# Patient Record
Sex: Male | Born: 1945 | Race: White | Hispanic: No | Smoking: Current every day smoker
Health system: Southern US, Community
[De-identification: ages and names within clinical notes are randomized; demographics above are authoritative.]

## PROBLEM LIST (undated history)

## (undated) DIAGNOSIS — E7800 Pure hypercholesterolemia, unspecified: Secondary | ICD-10-CM

## (undated) DIAGNOSIS — J449 Chronic obstructive pulmonary disease, unspecified: Secondary | ICD-10-CM

## (undated) DIAGNOSIS — I739 Peripheral vascular disease, unspecified: Secondary | ICD-10-CM

## (undated) DIAGNOSIS — R519 Headache, unspecified: Secondary | ICD-10-CM

## (undated) DIAGNOSIS — G4730 Sleep apnea, unspecified: Secondary | ICD-10-CM

## (undated) DIAGNOSIS — N400 Enlarged prostate without lower urinary tract symptoms: Secondary | ICD-10-CM

## (undated) DIAGNOSIS — E119 Type 2 diabetes mellitus without complications: Secondary | ICD-10-CM

## (undated) DIAGNOSIS — I2510 Atherosclerotic heart disease of native coronary artery without angina pectoris: Secondary | ICD-10-CM

## (undated) DIAGNOSIS — K219 Gastro-esophageal reflux disease without esophagitis: Secondary | ICD-10-CM

## (undated) DIAGNOSIS — K254 Chronic or unspecified gastric ulcer with hemorrhage: Secondary | ICD-10-CM

## (undated) DIAGNOSIS — C4490 Unspecified malignant neoplasm of skin, unspecified: Secondary | ICD-10-CM

## (undated) DIAGNOSIS — R51 Headache: Secondary | ICD-10-CM

## (undated) HISTORY — PX: OTHER SURGICAL HISTORY: SHX169

## (undated) HISTORY — PX: CHOLECYSTECTOMY: SHX55

## (undated) HISTORY — PX: CORONARY ANGIOPLASTY: SHX604

## (undated) HISTORY — DX: Unspecified malignant neoplasm of skin, unspecified: C44.90

## (undated) HISTORY — PX: HERNIA REPAIR: SHX51

---

## 2005-05-15 ENCOUNTER — Emergency Department: Payer: Self-pay | Admitting: General Practice

## 2007-09-30 ENCOUNTER — Ambulatory Visit: Payer: Self-pay | Admitting: Surgery

## 2007-10-01 ENCOUNTER — Ambulatory Visit (HOSPITAL_COMMUNITY): Admission: RE | Admit: 2007-10-01 | Discharge: 2007-10-02 | Payer: Self-pay | Admitting: Surgery

## 2007-10-02 ENCOUNTER — Ambulatory Visit: Payer: Self-pay | Admitting: Surgery

## 2007-10-03 ENCOUNTER — Inpatient Hospital Stay (HOSPITAL_COMMUNITY): Admission: RE | Admit: 2007-10-03 | Discharge: 2007-10-07 | Payer: Self-pay | Admitting: Surgery

## 2007-10-03 ENCOUNTER — Encounter: Payer: Self-pay | Admitting: Surgery

## 2007-10-04 ENCOUNTER — Encounter: Payer: Self-pay | Admitting: Surgery

## 2007-10-21 ENCOUNTER — Ambulatory Visit: Payer: Self-pay | Admitting: Surgery

## 2007-11-22 ENCOUNTER — Ambulatory Visit: Payer: Self-pay | Admitting: Vascular Surgery

## 2007-11-24 ENCOUNTER — Inpatient Hospital Stay (HOSPITAL_COMMUNITY): Admission: EM | Admit: 2007-11-24 | Discharge: 2007-11-29 | Payer: Self-pay | Admitting: Emergency Medicine

## 2007-11-24 ENCOUNTER — Ambulatory Visit: Payer: Self-pay | Admitting: Family Medicine

## 2007-11-25 ENCOUNTER — Ambulatory Visit: Payer: Self-pay | Admitting: Vascular Surgery

## 2007-12-02 ENCOUNTER — Encounter: Payer: Self-pay | Admitting: *Deleted

## 2007-12-02 ENCOUNTER — Ambulatory Visit: Payer: Self-pay | Admitting: Family Medicine

## 2007-12-02 DIAGNOSIS — I2699 Other pulmonary embolism without acute cor pulmonale: Secondary | ICD-10-CM | POA: Insufficient documentation

## 2007-12-02 DIAGNOSIS — I10 Essential (primary) hypertension: Secondary | ICD-10-CM | POA: Insufficient documentation

## 2007-12-02 DIAGNOSIS — S81809A Unspecified open wound, unspecified lower leg, initial encounter: Secondary | ICD-10-CM

## 2007-12-02 DIAGNOSIS — S91009A Unspecified open wound, unspecified ankle, initial encounter: Secondary | ICD-10-CM

## 2007-12-02 DIAGNOSIS — Z86711 Personal history of pulmonary embolism: Secondary | ICD-10-CM | POA: Insufficient documentation

## 2007-12-02 DIAGNOSIS — S81009A Unspecified open wound, unspecified knee, initial encounter: Secondary | ICD-10-CM

## 2007-12-02 LAB — CONVERTED CEMR LAB

## 2007-12-10 ENCOUNTER — Encounter: Payer: Self-pay | Admitting: Family Medicine

## 2009-02-20 ENCOUNTER — Encounter (INDEPENDENT_AMBULATORY_CARE_PROVIDER_SITE_OTHER): Payer: Self-pay | Admitting: *Deleted

## 2009-02-20 DIAGNOSIS — F17200 Nicotine dependence, unspecified, uncomplicated: Secondary | ICD-10-CM

## 2010-09-27 NOTE — Procedures (Signed)
DUPLEX ULTRASOUND OF ABDOMINAL AORTA   INDICATION:  Pulsatile abdomen.   HISTORY:  Diabetes:  No.  Cardiac:  No.  Hypertension:  No.  Smoking:  Yes.  Connective Tissue Disorder:  Family History:  Previous Surgery:   DUPLEX EXAM:         AP (cm)                   TRANSVERSE (cm)  Proximal             2.12 cm                   2.14 cm  Mid                  3.32 cm                   2.25 cm  Distal               2.09 cm                   2.33 cm  Right Iliac          Not well visualized       Not well visualized  Left Iliac           Not well visualized       Not well visualized   PREVIOUS:  Date:  AP:  TRANSVERSE:   IMPRESSION:  No evidence of abdominal aortic aneurysm noted.   ___________________________________________  V. Charlena Cross, MD   MG/MEDQ  D:  09/30/2007  T:  09/30/2007  Job:  [id number]

## 2010-09-27 NOTE — Procedures (Signed)
DUPLEX DEEP VENOUS EXAM - LOWER EXTREMITY   INDICATION:  Status post left popliteal-to-distal popliteal bypass graft  with pain and edema.   HISTORY:  Edema:  Yes.  Trauma/Surgery:  Yes.  Pain:  Yes.  PE:  No.  Previous DVT:  No.  Anticoagulants:  No.  Other:  No.   DUPLEX EXAM:                CFV   SFV   PopV  PTV    GSV                R  L  R  L  R  L  R   L  R  L  Thrombosis    o  o     o     o      o     o  Spontaneous   +  +     +     +      +     +  Phasic        +  +     +     +      +     +  Augmentation  +  +     +     +      +     +  Compressible  +  +     +     +      +     +  Competent     +  +     +     +      +     +   Legend:  + - yes  o - no  p - partial  D - decreased   IMPRESSION:  1. No evidence of deep venous thrombosis noted in the left leg.  2. Fluid collection that measured 2.12 cm X 7.93 cm noted at a distal      portion.  3. Patent left popliteal-popliteal bypass graft with no evidence of      focal stenosis.    _____________________________  V. Charlena Cross, M.D.   MG/MEDQ  D:  11/22/2007  T:  11/22/2007  Job:  [id number]

## 2010-09-27 NOTE — H&P (Signed)
NAMEMARCELLO, [name] NO.:  [id number]   MEDICAL RECORD NO.:  [id number]          PATIENT TYPE:  INP   LOCATION:  6740                         FACILITY:  [facility]   PHYSICIAN:  Alexander Ramp, MD        DATE OF BIRTH:  22-May-1945   DATE OF ADMISSION:  11/24/2007  DATE OF DISCHARGE:                              HISTORY & PHYSICAL   PRIMARY CARE PHYSICIAN:  [name] in [place] city.   CHIEF COMPLAINT:  Cough and shortness of breath x1 day.   HISTORY OF PRESENT ILLNESS:  A 65 year old white male with past medical  history significant for hyperlipidemia and thrombosed right popliteal  artery presents for coughing and shortness of breath.  He states the  coughing has been ongoing for several weeks, but in the past one day,  the coughing has acutely increased with a large increase in shortness of  breath.  He went to the ER in [facility], the night prior to admission,  and he stated that he was sent home with amoxicillin-clavulanate 875 mg  b.i.d.  Throughout the day, he had continued shortness of breath, and  returned this time to the [facility] ER.  He reports a home temperature  of 104.5 with fevers, chills, and right-sided chest pain radiating up  the ipsilateral neck and back.  This pain occurs when taking a large  breath or with any physical movement.  He describes the cough as mildly  productive with white sputum, but no blood.  The patient denies any sick  contact and no recent international travel.  The patient has had  drainage of popliteal bilateral bypass graft site for a week.  He  describes that it has changed recently in that it now has a foul odor.  He notes the drainage is clear.  It does not hurt.  He saw the surgeon 2  weeks ago for this as an outpatient.   In the ER, the patient received A/A nebs, Rocephin, and azithromycin.   PAST MEDICAL HISTORY:  1. Cluster headaches.  2. Hyperlipidemia.  3. Status post thrombosed right popliteal artery.   PAST SURGICAL HISTORY:  1. Oct 03, 2007, popliteal artery bypass.  2. Inguinal hernia repair at age 55.   SOCIAL HISTORY:  The patient was recently widowed on August 10, 2007, and  currently lives alone.  He is currently working as an Patent attorney.  He has a 1-pack per day x43 years smoking history, has 2  beers per week, and denies illicit drug use.   FAMILY HISTORY:  Mother:  Coronary artery disease, unsure of MI history.  Father:  Stroke, age 38.  Siblings:  No heart disease or diabetes.  Has  a sister with a gynecologic cancer.   REVIEW OF SYSTEMS:  Positive for fevers, chills, fatigue, headache,  chest pain, cough, dyspnea, and wheezing.  Negative for weight change,  sore throat, ear pain, PND, vomiting, diarrhea, bright red blood per  rectum, abdominal pain, or dysuria.   ALLERGIES:  IODINE/BETADINE causes skin blistering.   MEDICATIONS:  1. Crestor  20 mg.  2. Aspirin 81 mg.  3. Trazodone 50 mg 1-1/2 tablets daily.  4. Amoxicillin - clavulanic acid 875 b.i.d.  5. Tussin DM.   PHYSICAL EXAMINATION:  VITAL SIGNS:  Temperature 99.7, pulse 100-114,  respirations 18-26, blood pressure 140/90, 167/56, pulse ox 92-96%.  GENERAL:  Alert and oriented x3, sitting up in bed, uncomfortable with  increased work of breathing.  HEENT:  [name]/AT, EOMI, PERRLA.  Nares patent.  Oropharynx without exudate.  Tympanic membranes clear.  NECK:  No lymphadenopathy.  CARDIOVASCULAR:  Regular rate and rhythm, mildly tachycardic.  LUNGS:  Decreased breath sounds, crackles in bases bilaterally,  increased work of breathing.  ABDOMEN:  Positive bowel sounds, soft, nondistended.  No tenderness to  palpation.  EXTREMITIES:  A 2-cm scab on the right inner thigh with larger  surrounding area of induration, not currently draining, erythema around  the immediate area, smaller scab located distally also on incision site.  NEUROLOGIC:  Alert and oriented x3.  Cranial nerves II-XII grossly  intact.   SKIN:  Seborrheic keratosis, ?scattered across anterior thigh.   LABS AND STUDIES:  BNP less than 30.  Point-of-care enzymes:  CK-MB less  than 1, troponin I less than 0.05, and myoglobin 67.5.  WBCs 14.8,  hemoglobin 15.8, hematocrit 46, platelets 205, and PMN 79%.  Chest x-ray  shows left lower lobe air space disease, questionable infection, linear  atelectasis of right mid lung, small bilateral pleural effusions. I-STAT  shows sodium 138, potassium 4.3, chloride 103, bicarbonate 27, BUN 8,  creatinine 1.0, and glucose 127.  EKG shows sinus tachycardia, right  bundle-branch block, pulmonary disease pattern, cardiac enzymes x1  negative.  Wound culture pending.  Blood cultures x2 pending.   ASSESSMENT AND PLAN:  A 65 year old white male with hyperlipidemia  status post popliteal bypass with 1-day history of coughing, shortness  of breath.  1. Community-acquired pneumonia.  Admitted with chest x-ray and      clinical picture consistent with pneumonia.  We will start      doxicycline plus additional coverage for atypicals with      azithromycin.  We will obtain urinary Legionella and pneumococcal      testings.  The patient does not have high fever, acute onset of      illness, myalgias, or sore throat, so we will not get H1N1 swab at      this time.  We will obtain a sputum culture and Gram stain.  We      will place on droplet precautions.  Follow up with blood cultures.      We will treat the patient symptomatically with guaifenesin and      Tessalon Perles.  We will also give O2 and albuterol nebs p.r.n.  2. Chest pain.  The patient's chest pain is pleuritic in nature.  We      will get cardiac enzymes x2 and repeat EKG.  We will treat chest      pain with ibuprofen 800 mg b.i.d.  3. Leg abscess.  Abscess is in line with incision site with pleural      wound healing.  We will treat for methicillin-resistant      Staphylococcus aureus with doxicycline and we will follow up on the       wound culture.  May consider surgery consult in the morning.  4. Hyperlipidemia, on home regimen of Crestor.  5. Prophylaxis, Lovenox.  6. Fluids, electrolytes, nutrition/gastrointestinal.  The patient  reports decreased urine output.  We will place on maintenance IV      fluids, and we will start a heart-healthy diet.  7. Disposition:  Upon decreased work of breathing, afebrile.      Delbert Harness, MD  Electronically Signed      Alexander Ramp, MD  Electronically Signed    KB/MEDQ  D:  11/25/2007  T:  11/25/2007  Job:  [phone] [phone]

## 2010-09-27 NOTE — Assessment & Plan Note (Signed)
OFFICE VISIT   [name], [name]  DOB:  1945/06/05                                       09/30/2007  [id number]#:[id number]   REASON FOR VISIT:  Left popliteal aneurysm.   HISTORY:  This is a 65 year old gentleman that I am seeing at the  request of Dr. 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  The patient states that approximately 10 to 12 days  ago he began having problems with his left leg which he describes as  numbness and cool, being very cool.  He had a duplex ultrasound which  reveals a 2.2 x 2.6 popliteal aneurysm.  He has monophasic waveforms in  his left leg.  He has triphasic waveforms in his right leg.  Patient  does endorse smoking, he smokes approximately 1 pack a day.  He has no  other history of aneurysms.   REVIEW OF SYSTEMS:  GENERAL:  Negative for fevers, chills, weight loss,  weight gain.  CARDIAC:  Positive for shortness of breath with exertion.  PULMONARY:  Negative.  GI:  Negative.  GU:  Negative.  VASCULAR:  Has pain in legs with walking and lying flat.  NEURO:  Negative.  ORTHO:  Negative.  PSYCH:  Positive for cluster headaches.  ENT:  Negative.  HEME:  Negative.   PAST MEDICAL HISTORY:  Hypercholesterolemia.   PAST SURGICAL HISTORY:  Left inguinal hernia.   FAMILY HISTORY:  Positive for coronary artery disease in his mother and  father.   SOCIAL HISTORY:  He is widowed as of the end of March of this year.  Smokes approximately a pack a day.  Drinks approximately 1 drink per  day.   MEDICATIONS:  A baby aspirin per day, ibuprofen p.r.n., Crestor.   ALLERGIES:  IODINE.   PHYSICAL EXAMINATION:  Vital Signs:  Heart rate 74, blood pressure is  143/92.  General:  He is well-appearing, no acute distress.  HEENT:  Normocephalic, atraumatic.  Pupils are equal, sclera are anicteric.  Neck:  Supple, there is no JVD, there are no carotid bruits.  Cardiovascular:  Regular rate and rhythm, no  murmurs, rubs or gallops.  Pulmonary:  Lungs are clear bilaterally.  Abdomen:  Soft, nontender but  obese, no hepatosplenomegaly, no pulsatile mass.  Extremities:  Palpable  femoral pulses, the left leg is cooler than the right.  There is a mass  within the left popliteal fossa.  He has neuro and sensory function, are  intact in the left leg and specifically within the first and second  webspace.  Neuro:  Cranial nerves II through XII are grossly intact.  Psych:  He is alert and oriented x3.  Skin:  Without rash.   ASSESSMENT AND PLAN:  Left popliteal aneurysm.   Plan:  I feel this patient needs to be dealt with on an urgent basis.  This has been going on for approximately 2 weeks; however, at this point  I think he needs to undergo an arteriogram to evaluate the runoff of his  left leg.  We discussed the possibility of proceeding with bypass should  he have distal targets.  I am going to have a duplex today to evaluate  for a popliteal aneurysm in his right leg as well as to further evaluate  and get a baseline of duplex signals in the left  leg.  He is also going  to get a duplex of his abdomen to make sure he does not have any  abdominal aneurysm.  He is also going to get a vein map in anticipation  of surgery.  I have scheduled to undergo diagnostic arteriogram  tomorrow.  Should he not have any distal targets, we would consider  lytic therapy.  I am going to try to proceed with a bypass later on this  week.   Alexander Ny, MD  Electronically Signed   VWB/MEDQ  D:  09/30/2007  T:  10/01/2007  Job:  [id number]   cc:   [name].

## 2010-09-27 NOTE — Assessment & Plan Note (Signed)
OFFICE VISIT   LARK, RUNK  DOB:  [birth year]                                       10/21/2007  [id number]#:[id number]   REASON FOR VISIT:  Followup.   HISTORY:  This is a 65 year old gentleman who presented with a  thrombosed popliteal artery aneurysm on the left and severe left leg  pain.  On 10/03/2007 he underwent above knee to below-knee popliteal  artery bypass with reversed ipsilateral greater saphenous vein and  ligation of his left popliteal artery aneurysm.  His postoperative  operative course was uncomplicated.  He comes back in for his first  visit.  He has been doing well at home.  He has been eating well.  His  activity level has slowly been increasing.   PHYSICAL EXAMINATION:  Blood pressure 141/92, pulse 95, respirations 18.  On exam he is in no acute distress.  His incisions are well healed with  the exception of a 1 cm area in the above knee artery incision site.  He  said there has been minimal drainage.  The area is not inflamed.  There  is a fullness to that area.  There is no evidence of infection.  He has  palpable posterior tibial and dorsalis pedis pulses.   PLAN:  Status post left leg bypass.  He will be placed on the ultrasound  protocol.  He will be seen back in 3 months for ultrasound as well as to  see me.  He will be maintained only on aspirin.   [name], MD  Electronically Signed   VWB/MEDQ  D:  10/21/2007  T:  10/22/2007  Job:  [id number]

## 2010-09-27 NOTE — Procedures (Signed)
VASCULAR LAB EXAM   INDICATION:  Preop for known popliteal aneurysm, thrombosed.   HISTORY:  Diabetes:  No.  Cardiac:  No.  Hypertension:  No.   EXAM:  Duplex of bilateral lower extremity arteries and bilateral  greater saphenous veins.   IMPRESSION:  1. Left popliteal aneurysm noted, measuring 2.04 cm X 2.40 cm.  2. No flow noted below the left knee.  3. The right arterial system appeared to be normal with normal flow      and velocity.  4. Bilateral greater saphenous veins are within normal limits and      measurements for bypass graft.   ___________________________________________  V. Charlena Cross, MD   MG/MEDQ  D:  09/30/2007  T:  09/30/2007  Job:  [id number]

## 2010-09-27 NOTE — Discharge Summary (Signed)
NAMEFABRIZIO, FILIP                 ACCOUNT NO.:  [id number]   MEDICAL RECORD NO.:  [id number]          PATIENT TYPE:  INP   LOCATION:  6740                         FACILITY:  [facility]   PHYSICIAN:  Leighton Roach McDiarmid, M.D.DATE OF BIRTH:  October 18, 1945   DATE OF ADMISSION:  11/24/2007  DATE OF DISCHARGE:  11/29/2007                               DISCHARGE SUMMARY   PRIMARY CARE Jackeline Gutknecht:  Jamse Mead, MD, [place]   DISCHARGE DIAGNOSES:  1. Pulmonary emboli to the right lung.  2. Community-acquired pneumonia.  3. Left medial thigh abscess, status post popliteal bypass.  4. Hypertension.  5. Hyperlipidemia.   DISCHARGE MEDICATIONS:  1. Coumadin 7.5 mg p.o. daily.  2. Lisinopril 10 mg p.o. daily.  3. Lovenox 120 mg subcutaneous injection twice daily.  4. Senokot 2 tablets by mouth daily.  5. Vicodin 5/500 mg by mouth 3 times daily as needed for pain.   CONSULTS:  Vascular Surgery was consulted to address the left medial  thigh abscess, status post popliteal bypass.   PROCEDURE:  On November 26, 2007, the patient had a CT angiogram, which was  positive for pulmonary emboli to the right lung.   LABORATORY DATA:  H1N1 screening shows negative.  INR 1.4.  On November 27, 2007, the patient had a CBC with white blood cells 7.8, hemoglobin 13.2,  hematocrit 38.7, and platelets 230.  Respiratory culture showed normal  oropharynx with flora.  A wound culture showed multiple organisms and  none were dominant, no Staph aureus, no group A strep.   BRIEF HOSPITAL COURSE:  This is a 65 year old male who was admitted for  community-acquired pneumonia.  The patient was placed on azithromycin  and doxycycline.  Once here, the patient complained of pleuritic right  chest pain.  A CT angiogram was ordered, which showed positive for  pulmonary emboli to the right lung.  T  1. Pulmonary emboli to the right lung.  Ct angiogram of chest showed      pulmonary emboli to the right lung.  The The patient was  placed on      Lovenox 120 mg subcu b.i.d. and Coumadin for bridging.  His INR was      checked daily with a therapeutic goal of INR of 2-3.  Once the      patient was hemodynamically stable and the patient had desired to      go home, the patient was given instructions on how to self inject      the Lovenox and also the importance of taking all his medication      including Coumadin and Lovenox everyday and to follow up at the      [facility] for followup and INR check in 3      days after discharge.  The patient stated that he was able to do      this, and would also like to follow up with his primary care      doctor, Dr. Jamse Mead in [facility], once he has his first  initial followup here at the [facility].  He last had a      Coumadin dose of 7.5 mg, which was the advised dose from pharmacy.  2. Community-acquired pneumonia.  The patient finished a 5-day course      of azithromycin.  His white count trended down from admission date      of 14.9 to 7.8 on November 28, 2007.  The patient has been afebrile      during the course of hospitalization.  He continues to have a      cough, but he is a longtime smoker and he has always had periodic      cough.  The patient does not have a requirement for oxygen and had      a saturation between 93-95% on room air while he was walking with      nursing staff.  3. Left medial thigh abscess status post popliteal bypass.  Vascular      Surgery was consulted for this and they ordered changing of      dressing and packing from wet-to-dry 3 times daily.  Upon      discharge, the patient is to have Home Health Care come to his      house, to show him how to pack and dress this abscess.  The patient      practiced this in the hospital the night before discharge and was      uncomfortable with the ability to do this at home.  4. Hypertension.  The patient's blood pressure range from the 120s-      150s  systolically and 50s-90s systolically while he was      hospitalized.  He was placed on lisinopril 10 mg p.o. daily, which      is also an ACE inhibitor, which the patient with coronary artery      disease should take for cardio protection.  5. Hyperlipidemia.  The patient is to continue home dose of Crestor 20      mg p.o. daily.   DISCHARGE INSTRUCTIONS:  The patient is discharged home to increase  activity slowly.  The patient is to have a low-sodium heart healthy  diet.  The patient is to have home health that will come to his home,  will supervise, and show him how to change the dressing and packing, and  he is also to follow up with Vascular Surgery as an outpatient.  The  patient is to follow up with Dr. Constance Goltz at the [facility] on  Monday, December 02, 2007, at 3 o'clock.  There, he would have an INR check  and also a hospital followup exam with Dr. Constance Goltz.  The patient should  also have a blood test done in 2 weeks to check for BMET, since  lisinopril was started while he was hospitalized.   DISCHARGE CONDITION:  The patient was discharged home in stable medical  condition.      Angeline Slim, MD  Electronically Signed      Leighton Roach McDiarmid, M.D.  Electronically Signed    CT/MEDQ  D:  11/29/2007  T:  11/30/2007  Job:  [id number]   cc:   Romero Belling, MD  Vascular Surgery

## 2010-09-27 NOTE — Op Note (Signed)
[name], [name]                 ACCOUNT NO.:  [id number]   MEDICAL RECORD NO.:  [id number]          PATIENT TYPE:  OIB   LOCATION:  5159                         FACILITY:  [facility]   PHYSICIAN:  Juleen China IV, MDDATE OF BIRTH:  Apr 22, 1946   DATE OF PROCEDURE:  10/01/2007  DATE OF DISCHARGE:                               OPERATIVE REPORT   PREOPERATIVE DIAGNOSIS:  Thrombosed left popliteal artery aneurysm.   POSTOPERATIVE DIAGNOSIS:  Thrombosed left popliteal artery aneurysm.   PROCEDURE PERFORMED:  1. Ultrasound-guided access right common femoral artery.  2. Abdominal aortogram.  3. Left lower extremity runoff.  4. Second-order catheterization.   PROCEDURE:  The patient was identified in the holding area and taken to  room 8.  He was placed supine on the table.  Bilateral groins were  prepped and draped in standard sterile fashion.  Time-out was called.  The right common femoral artery was guided with ultrasound and was found  to be patent.  A 1% lidocaine was used for local anesthesia.  The right  common femoral artery was accessed under ultrasound guidance with an 18-  gauge needle.  A #3 fiber wire was advanced in retrograde fashion into  the abdominal aorta under fluoroscopic visualization.  A 5-French sheath  was placed.  The wire and Omni flush catheter was placed at L1 and  abdominal aortogram was obtained.  Next, cath was pulled down to the  aortic bifurcation and pelvic angiogram was obtained.  Next, using an  Omni flush catheter Glidewire access was obtained into the left common  femoral artery.  Aortic bifurcation was crossed with a 4-French end-hole  catheter.  The catheter was placed in the left external iliac artery.  The left lower extremity runoff was obtained.   FINDINGS:  Aortogram:  The visualized portions of suprarenal abdominal  aorta showed minimal disease.  There is single renal arteries  bilaterally, which were widely patent.  There is mild disease  within the  infrarenal abdominal aorta.  The right common iliac artery is widely  patent with minimal disease and left and right external iliac artery is  widely patent with minimal disease.  The right hypogastric artery is  patent with minimal disease.  The left common iliac artery is patent  with minimal disease.  The left external iliac artery is widely patent  with minimal disease.  The left hypogastric artery is widely patent with  minimal disease.   Left lower extremity:  The left common femoral artery is widely patent  with minimal disease.  Left profunda femoral artery is widely patent  with minimal disease.  Left superficial femoral artery is patent  throughout its course.  The level of the bone, the proximal popliteal  artery is occluded multiple geniculate collateral vessels, good  reconstitution to the below-knee popliteal artery.  There is immobile  thrombus within the below-knee popliteal artery.  The patient has three-  vessel runoff.   The above images were obtained, decision made to end the procedure.  All  catheters and wires were removed.  The patient was taken to the  holding  area for sheath pull.  There were no complications.   IMPRESSION:  Occluded popliteal artery with reconstitution of the below-  knee popliteal artery and  three-vessel runoff.           ______________________________  V. Charlena Cross, MD  Electronically Signed     VWB/MEDQ  D:  10/01/2007  T:  10/02/2007  Job:  [id number]

## 2010-09-27 NOTE — Op Note (Signed)
[name], [name]                 ACCOUNT NO.:  [id number]   MEDICAL RECORD NO.:  [id number]          PATIENT TYPE:  INP   LOCATION:  3303                         FACILITY:  [facility]   PHYSICIAN:  Juleen China IV, MDDATE OF BIRTH:  31-Jul-1945   DATE OF PROCEDURE:  DATE OF DISCHARGE:                               OPERATIVE REPORT   PREOPERATIVE DIAGNOSIS:  Thrombosed left popliteal artery aneurysm.   POSTOPERATIVE DIAGNOSIS:  Thrombosed left popliteal artery aneurysm.   PROCEDURE PERFORMED:  1. Above-knee to below-knee popliteal artery bypass on the left with      reversed ipsilateral greater saphenous vein.  2. Ligation of left popliteal artery aneurysm.   TYPE OF ANESTHESIA:  General.   BLOOD LOSS:  250 mL.   FINDINGS:  Dense inflammatory reaction throughout the aneurysm in the  above-knee popliteal space.   SPECIMENS:  Thrombus.   COMPLICATIONS:  None.   INDICATIONS:  This is a 65 year old gentleman who presented to my office  on Sep 30, 2007.  At that time, he had complaints of severe left leg  pain.  He had previously been diagnosed with a popliteal artery aneurysm  on the left.  I took him for an arteriogram on Oct 01, 2007, at which  time I found that thrombosed popliteal artery with reconstitution of the  below-knee popliteal artery.  At that time, I elected to proceed with  operative intervention.  He was preoperatively vein mapped and found to  have adequate ipsilateral conduit.  Risks and benefits were discussed.  Informed consent was signed.   PROCEDURE:  The patient was identified in the holding area and taken to  room #6, he was placed supine on table.  General endotracheal anesthesia  was administered.  The patient was prepped and draped in standard  sterile fashion.  At time-out I was called and antibiotics were given.  First these all found to map up course of the left greater saphenous  vein.  I took a posterior course around the knee.  I first began  making  the below-knee incision between the ultrasound mapped course of the vein  and the normal location.  The subcutaneous tissue was opened with  cautery.  I first elected to expose the vein in this area.  Once the  vein was identified, it was mobilized throughout the length of the  incision.  Side branches were divided between silk ties and metal clips.  Once the vein had been mobilized throughout the length of the incision,  it was reflected anteriorly.  I then used cautery to dissect out the  crural fascia.  The crural fascia was opened with cautery.  The  gastrocnemius muscle was reflected posteriorly.  Cerebellar retractors  were used stay with exposure.  The popliteal space was identified.  Neurovascular bundle was identified and the tissue around it was  mobilized with Metzenbaum scissors.  The vein was first  circumferentially mobilized and reflected posteriorly.  I then  identified the below-knee popliteal artery.  This was mobilized  proximally and distally for appropriate length.  Next, I made a counter  incision above the knee and identify the greater saphenous vein until we  mobilized it throughout the length of the incision ligating the side  branches between 3-0 silk ties and metal clips and bridge was used  between the two incision.  Once the vein then fully mobilized, I did not  feel that I could reach the popliteal fossa from this incision and  therefore a parallel incision was made anteriorly.  The cautery was used  to dissect the subcutaneous tissue.  The crural fascia was identified.  It was opened sharply with cautery.  The popliteal space was then  entered.  There was a dense inflammatory reaction around the artery.  After mobilizing the vein, I identified a nonaneurysmal artery.  This  was dissected up to the adductor hiatus.  The adductor hiatus was  partially taken down with cautery.  At this point, I felt like I had  adequate exposure of the popliteal artery.   Again, this was somewhat  cumbersome given the dense inflammation surrounding the artery and this  area.  The artery appeared soft and was suitable.  There was a palpable  pulse as well as a signal of Doppler on the artery at this level.  At  this point, I ensured that I had adequate length on the vein and then  the vein was removed.  The ends were oversewn with 2-0 silk ties and  vascular clips.  He was taken to the back table, a vein cannula was  placed.  The vein was then instilled heparinized saline.  The vein  appeared to be of an adequate diameter to serve as a good conduit  measuring approximately for 5 mm.  Once the vein distended, it was  marked with an ink pen to ensure its appropriate orientation and then it  was placed on the back table.  Next, the patient was given systemic  heparinization.  The popliteal artery above the knee was first occluded  with vascular clamps.  It was opened with an #11 blade and extended with  Potts scissors.  There was thrombus noted within the artery at this  level.  The thrombus was removed and then sent to pathology.  Once I had  evacuated all the thrombus, released proximal clamp and there was  excellent inflow.  Next, the vein was brought up on the table.  It was  placed in a reversed fashion.  The end was spatulated to fit the size of  the arteriotomy.  A running anastomosis was completed using a 5-0  Prolene suture.  Once the anastomosis was completed, the proximal clamp  was released and there was excellent flow through the conduit.  The  distal end of the artery was then oversewn using a 2-0 silk suture  ligature and two additional 2-0 silk ties.  A tunnel which had  previously been created using a straight tunneler was used to pass the  conduit through to the below-knee incision.  I made sure that the vein  was in its appropriate orientation.  Next, I set up for the distal  anastomosis.  The artery was clamped proximally and distally.  It  was  opened with an #11 blade and extended with Potts scissors.  I released  the distal clamp and there was good backbleeding.  I released proximal  clamp and there was no antegrade flow.  I placed a hemostat proximally  and there was thrombus proximally.  Next, the vein was cut to the  appropriate length  with the vein fully distended.  The vein graft was  then occluded after cutting the vein to the appropriate length.  The  vein end was spatulated to fit to the length of the arteriotomy.  A  running anastomosis was created using a 6-0 Prolene suture.  Prior to  completion of the anastomosis, the vein conduit was flushed.  The distal  clamp on the below-knee popliteal artery was released and again  excellent backbleeding was encountered.  The anastomosis was then  secured.  The clamps were released.  The anastomosis was hemostatic.  Doppler was used to evaluate distal flow and there was a good signal in  the anterior tibial artery.  This signal went away with occlusion of the  bypass graft.  At this point in time, I was happy with repair.  The  patient's heparin was reversed with 50 mg of protamine.  The wounds were  then copiously irrigated.  In the above incision, the crural fascia was  reapproximated with running 2-0 Vicryl.  The skin and the vein harvest  site was closed with running 2-0 Vicryl and the skin was closed with 4-0  Vicryl and the below-knee incision, mid fascia was reapproximated with 2-  0 Vicryl.  At this point, I further evaluated the bypass graft and I was  unhappy with the signal and therefore took down the crural fascial  closure and reevaluated the signal and there was a return of a good  signal.  With this finding, I elected not to close the fascia and the  below-knee incision.  The subcutaneous tissue was reapproximated with 3-  0 Vicryl.  The skin was closed with 4-0 Vicryl.  Dermabond was then  placed.  At this point, the patient had an excellent signal in the   anterior tibial artery.  He was then successfully awakened from the  anesthesia and taken to the recovery room in stable condition.           ______________________________  V. Charlena Cross, MD  Electronically Signed     VWB/MEDQ  D:  10/03/2007  T:  10/04/2007  Job:  [id number]

## 2010-09-27 NOTE — Discharge Summary (Signed)
NAMEOTTIS, VACHA                 ACCOUNT NO.:  [id number]   MEDICAL RECORD NO.:  [id number]          PATIENT TYPE:  INP   LOCATION:  2011                         FACILITY:  [facility]   PHYSICIAN:  Juleen China IV, MDDATE OF BIRTH:  1946/02/27   DATE OF ADMISSION:  10/03/2007  DATE OF DISCHARGE:  10/07/2007                               DISCHARGE SUMMARY   FINAL DIAGNOSIS:  Thrombosed left popliteal artery aneurysm.   SECONDARY DIAGNOSIS:  Hypercholesterolemia.   PROCEDURE:  On Oct 03, 2007, the patient went above knee to below knee  popliteal artery bypass with reversed ipsilateral greater saphenous vein  and ligation of left popliteal artery aneurysm.   SUMMARY OF HOSPITAL COURSE:  This is a 65 year old gentleman who  presented initially as an outpatient with a thrombosed left popliteal  artery aneurysm.  He was taken for diagnostic arteriogram, and 2 days  later on Oct 03, 2007, he was admitted to the hospital and underwent  popliteal artery bypass with vein and ligation of his aneurysm.  His  postoperative course was uncomplicated.  At the time of discharge, he  was able to ambulate without assistance.  He was tolerating regular  diet.  His pain was well controlled.  He was said to be discharge to  home.  He will be given a followup appointment in 2 weeks.   DISCHARGE MEDICATIONS:  1. Crestor 20 mg per day.  2. Aspirin 81 mg per day.  3. Ibuprofen p.r.n.  4. Percocet 5/325 mg 1-2 p.o. q.4-6 hours p.r.n.   DISCHARGE INSTRUCTIONS:  The patient will remain out of work until he  returns to see me in the office.  He can shower.  He should gradually  increase his activities.  He is to keep his left leg elevated when  possible.           ______________________________  V. Charlena Cross, MD  Electronically Signed     VWB/MEDQ  D:  10/07/2007  T:  10/07/2007  Job:  [id number]

## 2011-02-08 LAB — URINE MICROSCOPIC-ADD ON

## 2011-02-08 LAB — COMPREHENSIVE METABOLIC PANEL
ALT: 33
AST: 24
Albumin: 3.4 — ABNORMAL LOW
BUN: 7
CO2: 31
Chloride: 105
Chloride: 107
Creatinine, Ser: 0.71
GFR calc Af Amer: >60
GFR calc non Af Amer: >60
Glucose, Bld: 107 — ABNORMAL HIGH
Potassium: 4.9
Sodium: 143
Total Bilirubin: 0.8
Total Bilirubin: 0.9
Total Protein: 6.7

## 2011-02-08 LAB — PROTIME-INR
INR: 0.9
Prothrombin Time: 12.5

## 2011-02-08 LAB — CBC
HCT: 47.9
MCV: 88.6
Platelets: 198
Platelets: 224
RBC: 5.39
RDW: 12.9
RDW: 13.1
WBC: 11.4 — ABNORMAL HIGH
WBC: 14 — ABNORMAL HIGH

## 2011-02-08 LAB — BASIC METABOLIC PANEL
BUN: 7
Calcium: 8.5
Creatinine, Ser: 0.94
GFR calc non Af Amer: >60
Glucose, Bld: 246 — ABNORMAL HIGH

## 2011-02-08 LAB — APTT: aPTT: 33

## 2011-02-08 LAB — URINALYSIS, ROUTINE W REFLEX MICROSCOPIC
Bilirubin Urine: NEGATIVE
Glucose, UA: NEGATIVE
Hgb urine dipstick: NEGATIVE
Specific Gravity, Urine: 1.018
Urobilinogen, UA: 0.2

## 2011-02-08 LAB — TYPE AND SCREEN

## 2011-02-09 LAB — CBC
HCT: 46
Hemoglobin: 15
Hemoglobin: 15.8
MCV: 89.3
Platelets: 180
Platelets: 183
RBC: 4.91
RDW: 13.3
RDW: 13.8
WBC: 10.8 — ABNORMAL HIGH
WBC: 14.8 — ABNORMAL HIGH
WBC: 14.9 — ABNORMAL HIGH
WBC: 8.6

## 2011-02-09 LAB — DIFFERENTIAL
Basophils Absolute: 0
Eosinophils Relative: 0
Lymphocytes Relative: 12
Lymphs Abs: 1.8
Monocytes Absolute: 1.3 — ABNORMAL HIGH
Neutro Abs: 11.6 — ABNORMAL HIGH

## 2011-02-09 LAB — CULTURE, RESPIRATORY W GRAM STAIN

## 2011-02-09 LAB — POCT CARDIAC MARKERS
CKMB, poc: <1 — ABNORMAL LOW
Myoglobin, poc: 67.5
Troponin i, poc: <0.05

## 2011-02-09 LAB — POCT I-STAT, CHEM 8
Calcium, Ion: 1.05 — ABNORMAL LOW
Chloride: 103
Glucose, Bld: 127 — ABNORMAL HIGH
HCT: 48
Hemoglobin: 16.3
Potassium: 4.3

## 2011-02-09 LAB — BASIC METABOLIC PANEL
BUN: 10
CO2: 26
Calcium: 9.4
Chloride: 105
Creatinine, Ser: 1.13
GFR calc Af Amer: >60
GFR calc non Af Amer: >60
Glucose, Bld: 111 — ABNORMAL HIGH
Sodium: 139
Sodium: 141

## 2011-02-09 LAB — WOUND CULTURE: Gram Stain: NONE SEEN

## 2011-02-09 LAB — B-NATRIURETIC PEPTIDE (CONVERTED LAB): Pro B Natriuretic peptide (BNP): <30

## 2011-02-09 LAB — EXPECTORATED SPUTUM ASSESSMENT W GRAM STAIN, RFLX TO RESP C

## 2011-02-09 LAB — CULTURE, BLOOD (ROUTINE X 2)
Culture: NO GROWTH
Culture: NO GROWTH

## 2011-02-09 LAB — H1N1 SCREEN (PCR): H1N1 Virus Scrn: NOT DETECTED

## 2011-02-09 LAB — PROTIME-INR
INR: 1
INR: 1
Prothrombin Time: 13.5
Prothrombin Time: 13.7

## 2011-02-09 LAB — LEGIONELLA ANTIGEN, URINE

## 2011-02-09 LAB — CK TOTAL AND CKMB (NOT AT ARMC)
CK, MB: 0.5
Total CK: 48

## 2011-02-09 LAB — TROPONIN I: Troponin I: <0.01

## 2011-02-10 LAB — CBC
HCT: 38.7 — ABNORMAL LOW
Hemoglobin: 13
Platelets: 230
Platelets: 267
RBC: 4.36
RDW: 13.2
WBC: 7.8

## 2011-02-10 LAB — EXPECTORATED SPUTUM ASSESSMENT W GRAM STAIN, RFLX TO RESP C

## 2011-02-10 LAB — PROTIME-INR
INR: 1
INR: 1.4
Prothrombin Time: 13.4
Prothrombin Time: 18 — ABNORMAL HIGH

## 2011-02-10 LAB — CULTURE, RESPIRATORY W GRAM STAIN

## 2012-04-16 ENCOUNTER — Ambulatory Visit: Payer: Self-pay | Admitting: Internal Medicine

## 2012-04-20 ENCOUNTER — Inpatient Hospital Stay: Payer: Self-pay | Admitting: Student

## 2012-04-20 LAB — COMPREHENSIVE METABOLIC PANEL
Alkaline Phosphatase: 74 U/L (ref 50–136)
Anion Gap: 10 (ref 7–16)
Bilirubin,Total: 0.5 mg/dL (ref 0.2–1.0)
Calcium, Total: 8.4 mg/dL — ABNORMAL LOW (ref 8.5–10.1)
Chloride: 104 mmol/L (ref 98–107)
Co2: 26 mmol/L (ref 21–32)
Creatinine: 1.41 mg/dL — ABNORMAL HIGH (ref 0.60–1.30)
EGFR (African American): >60
Osmolality: 297 (ref 275–301)
Potassium: 5.1 mmol/L (ref 3.5–5.1)
SGPT (ALT): 48 U/L (ref 12–78)
Sodium: 140 mmol/L (ref 136–145)
Total Protein: 5.9 g/dL — ABNORMAL LOW (ref 6.4–8.2)

## 2012-04-20 LAB — URINALYSIS, COMPLETE
Glucose,UR: 50 mg/dL (ref 0–75)
Leukocyte Esterase: NEGATIVE
Nitrite: NEGATIVE
Ph: 5 (ref 4.5–8.0)
Protein: 100
Specific Gravity: >1.06 (ref 1.003–1.030)

## 2012-04-20 LAB — CBC WITH DIFFERENTIAL/PLATELET
Basophil #: 0.2 10*3/uL — ABNORMAL HIGH (ref 0.0–0.1)
Eosinophil #: 0.1 10*3/uL (ref 0.0–0.7)
Lymphocyte #: 3.7 10*3/uL — ABNORMAL HIGH (ref 1.0–3.6)
MCH: 30.7 pg (ref 26.0–34.0)
MCHC: 34.1 g/dL (ref 32.0–36.0)
Neutrophil %: 75.5 %
Platelet: 305 10*3/uL (ref 150–440)

## 2012-04-20 LAB — CK TOTAL AND CKMB (NOT AT ARMC)
CK, Total: 32 U/L — ABNORMAL LOW (ref 35–232)
CK, Total: 45 U/L (ref 35–232)
CK, Total: 73 U/L (ref 35–232)
CK-MB: 2.5 ng/mL (ref 0.5–3.6)
CK-MB: 3.8 ng/mL — ABNORMAL HIGH (ref 0.5–3.6)

## 2012-04-20 LAB — HEMOGLOBIN: HGB: 12.1 g/dL — ABNORMAL LOW (ref 13.0–18.0)

## 2012-04-20 LAB — TROPONIN I: Troponin-I: 0.06 ng/mL — ABNORMAL HIGH

## 2012-04-20 LAB — PROTIME-INR: Prothrombin Time: 29.1 secs — ABNORMAL HIGH (ref 11.5–14.7)

## 2012-04-21 LAB — CBC WITH DIFFERENTIAL/PLATELET
Basophil #: 0.1 10*3/uL (ref 0.0–0.1)
Basophil %: 0.6 %
Eosinophil #: 0.2 10*3/uL (ref 0.0–0.7)
Eosinophil %: 1.1 %
HCT: 31.4 % — ABNORMAL LOW (ref 40.0–52.0)
HGB: 10.7 g/dL — ABNORMAL LOW (ref 13.0–18.0)
Lymphocyte %: 21.3 %
MCHC: 34 g/dL (ref 32.0–36.0)
MCV: 89 fL (ref 80–100)
Monocyte #: 1.2 x10 3/mm — ABNORMAL HIGH (ref 0.2–1.0)
Monocyte %: 7.4 %
Neutrophil #: 11.4 10*3/uL — ABNORMAL HIGH (ref 1.4–6.5)
Neutrophil %: 69.6 %
RDW: 13.5 % (ref 11.5–14.5)
WBC: 16.4 10*3/uL — ABNORMAL HIGH (ref 3.8–10.6)

## 2012-04-21 LAB — COMPREHENSIVE METABOLIC PANEL
Alkaline Phosphatase: 54 U/L (ref 50–136)
BUN: 73 mg/dL — ABNORMAL HIGH (ref 7–18)
Bilirubin,Total: 0.3 mg/dL (ref 0.2–1.0)
Creatinine: 1.44 mg/dL — ABNORMAL HIGH (ref 0.60–1.30)
Glucose: 140 mg/dL — ABNORMAL HIGH (ref 65–99)
SGPT (ALT): 36 U/L (ref 12–78)
Total Protein: 5.7 g/dL — ABNORMAL LOW (ref 6.4–8.2)

## 2012-04-21 LAB — LIPID PANEL
Cholesterol: 107 mg/dL (ref 0–200)
HDL Cholesterol: 18 mg/dL — ABNORMAL LOW (ref 40–60)
Ldl Cholesterol, Calc: 47 mg/dL (ref 0–100)
Triglycerides: 211 mg/dL — ABNORMAL HIGH (ref 0–200)

## 2012-04-21 LAB — PROTIME-INR
INR: 2.2
Prothrombin Time: 24.5 secs — ABNORMAL HIGH (ref 11.5–14.7)

## 2012-04-22 LAB — BASIC METABOLIC PANEL
Anion Gap: 5 — ABNORMAL LOW (ref 7–16)
BUN: 22 mg/dL — ABNORMAL HIGH (ref 7–18)
Chloride: 109 mmol/L — ABNORMAL HIGH (ref 98–107)
Creatinine: 0.85 mg/dL (ref 0.60–1.30)
EGFR (African American): >60
Osmolality: 289 (ref 275–301)
Potassium: 4.2 mmol/L (ref 3.5–5.1)

## 2012-04-22 LAB — PROTIME-INR
INR: 1.1
Prothrombin Time: 14.5 secs (ref 11.5–14.7)

## 2012-04-22 LAB — CBC WITH DIFFERENTIAL/PLATELET
Basophil #: 0.2 10*3/uL — ABNORMAL HIGH (ref 0.0–0.1)
HCT: 28 % — ABNORMAL LOW (ref 40.0–52.0)
HGB: 9.6 g/dL — ABNORMAL LOW (ref 13.0–18.0)
Lymphocyte %: 16.2 %
MCHC: 34.3 g/dL (ref 32.0–36.0)
Neutrophil #: 7 10*3/uL — ABNORMAL HIGH (ref 1.4–6.5)
Neutrophil %: 72.7 %
Platelet: 186 10*3/uL (ref 150–440)
RDW: 13.2 % (ref 11.5–14.5)
WBC: 9.6 10*3/uL (ref 3.8–10.6)

## 2012-04-22 LAB — HEMOGLOBIN
HGB: 9.4 g/dL — ABNORMAL LOW (ref 13.0–18.0)
HGB: 9.8 g/dL — ABNORMAL LOW (ref 13.0–18.0)

## 2012-04-23 LAB — CBC WITH DIFFERENTIAL/PLATELET
Basophil #: 0.1 10*3/uL (ref 0.0–0.1)
Basophil %: 0.9 %
Eosinophil #: 0.2 10*3/uL (ref 0.0–0.7)
HGB: 9.2 g/dL — ABNORMAL LOW (ref 13.0–18.0)
Lymphocyte %: 26.4 %
MCH: 31.6 pg (ref 26.0–34.0)
MCHC: 35.4 g/dL (ref 32.0–36.0)
MCV: 89 fL (ref 80–100)
Monocyte #: 0.6 x10 3/mm (ref 0.2–1.0)
Monocyte %: 7.7 %
Neutrophil %: 62.3 %
Platelet: 173 10*3/uL (ref 150–440)
RDW: 13.3 % (ref 11.5–14.5)
WBC: 8 10*3/uL (ref 3.8–10.6)

## 2012-04-23 LAB — PROTIME-INR
INR: 1
Prothrombin Time: 13.8 secs (ref 11.5–14.7)

## 2012-04-23 LAB — BASIC METABOLIC PANEL
Anion Gap: 5 — ABNORMAL LOW (ref 7–16)
Calcium, Total: 8.2 mg/dL — ABNORMAL LOW (ref 8.5–10.1)
Chloride: 106 mmol/L (ref 98–107)
Co2: 28 mmol/L (ref 21–32)
EGFR (African American): >60
Glucose: 120 mg/dL — ABNORMAL HIGH (ref 65–99)
Osmolality: 277 (ref 275–301)

## 2012-04-24 LAB — CBC WITH DIFFERENTIAL/PLATELET
Basophil #: 0.1 10*3/uL (ref 0.0–0.1)
Basophil %: 1 %
Eosinophil #: 0.3 10*3/uL (ref 0.0–0.7)
HCT: 25.4 % — ABNORMAL LOW (ref 40.0–52.0)
HGB: 8.9 g/dL — ABNORMAL LOW (ref 13.0–18.0)
Lymphocyte %: 25.8 %
MCH: 31.1 pg (ref 26.0–34.0)
MCHC: 34.9 g/dL (ref 32.0–36.0)
Monocyte #: 0.5 x10 3/mm (ref 0.2–1.0)
Monocyte %: 6.4 %
Neutrophil #: 4.9 10*3/uL (ref 1.4–6.5)
Neutrophil %: 63.3 %
RBC: 2.86 10*6/uL — ABNORMAL LOW (ref 4.40–5.90)

## 2012-04-24 LAB — EXPECTORATED SPUTUM ASSESSMENT W GRAM STAIN, RFLX TO RESP C

## 2012-04-25 LAB — HEMOGLOBIN: HGB: 9.3 g/dL — ABNORMAL LOW (ref 13.0–18.0)

## 2012-04-25 LAB — CULTURE, BLOOD (SINGLE)

## 2012-04-30 ENCOUNTER — Emergency Department: Payer: Self-pay | Admitting: Emergency Medicine

## 2012-04-30 LAB — CBC
HGB: 11.3 g/dL — ABNORMAL LOW (ref 13.0–18.0)
MCH: 30 pg (ref 26.0–34.0)
MCV: 90 fL (ref 80–100)
Platelet: 274 10*3/uL (ref 150–440)
RBC: 3.75 10*6/uL — ABNORMAL LOW (ref 4.40–5.90)
RDW: 13.9 % (ref 11.5–14.5)

## 2012-04-30 LAB — COMPREHENSIVE METABOLIC PANEL
Albumin: 3.6 g/dL (ref 3.4–5.0)
Alkaline Phosphatase: 71 U/L (ref 50–136)
BUN: 5 mg/dL — ABNORMAL LOW (ref 7–18)
Bilirubin,Total: 0.4 mg/dL (ref 0.2–1.0)
Co2: 25 mmol/L (ref 21–32)
Creatinine: 0.8 mg/dL (ref 0.60–1.30)
EGFR (Non-African Amer.): >60
Glucose: 135 mg/dL — ABNORMAL HIGH (ref 65–99)
Osmolality: 279 (ref 275–301)
SGPT (ALT): 50 U/L (ref 12–78)
Sodium: 140 mmol/L (ref 136–145)
Total Protein: 7 g/dL (ref 6.4–8.2)

## 2012-04-30 LAB — CK TOTAL AND CKMB (NOT AT ARMC)
CK, Total: 31 U/L — ABNORMAL LOW (ref 35–232)
CK-MB: <0.5 ng/mL — ABNORMAL LOW (ref 0.5–3.6)

## 2012-04-30 LAB — TROPONIN I: Troponin-I: <0.02 ng/mL

## 2012-05-22 ENCOUNTER — Ambulatory Visit: Payer: Self-pay | Admitting: Internal Medicine

## 2012-05-29 LAB — COMPREHENSIVE METABOLIC PANEL
Albumin: 4.5 g/dL (ref 3.4–5.0)
Alkaline Phosphatase: 87 U/L (ref 50–136)
Anion Gap: 9 (ref 7–16)
BUN: 7 mg/dL (ref 7–18)
Bilirubin,Total: 0.6 mg/dL (ref 0.2–1.0)
Calcium, Total: 9.3 mg/dL (ref 8.5–10.1)
Chloride: 104 mmol/L (ref 98–107)
Creatinine: 0.94 mg/dL (ref 0.60–1.30)
EGFR (African American): >60
EGFR (Non-African Amer.): >60
SGOT(AST): 49 U/L — ABNORMAL HIGH (ref 15–37)
SGPT (ALT): 68 U/L (ref 12–78)
Sodium: 140 mmol/L (ref 136–145)

## 2012-05-29 LAB — CBC WITH DIFFERENTIAL/PLATELET
Basophil #: 0.1 10*3/uL (ref 0.0–0.1)
Eosinophil %: 0.2 %
HCT: 42.9 % (ref 40.0–52.0)
HGB: 14 g/dL (ref 13.0–18.0)
Lymphocyte %: 8.1 %
MCH: 28.1 pg (ref 26.0–34.0)
MCHC: 32.7 g/dL (ref 32.0–36.0)
Monocyte %: 4.4 %
Neutrophil #: 10.9 10*3/uL — ABNORMAL HIGH (ref 1.4–6.5)
RDW: 14.1 % (ref 11.5–14.5)

## 2012-05-29 LAB — TROPONIN I: Troponin-I: <0.02 ng/mL

## 2012-05-30 LAB — TROPONIN I: Troponin-I: <0.02 ng/mL

## 2012-05-31 ENCOUNTER — Inpatient Hospital Stay: Payer: Self-pay | Admitting: Internal Medicine

## 2012-05-31 LAB — PROTIME-INR: Prothrombin Time: 12.5 secs (ref 11.5–14.7)

## 2012-05-31 LAB — LIPASE, BLOOD: Lipase: 75 U/L (ref 73–393)

## 2012-05-31 LAB — OCCULT BLOOD X 1 CARD TO LAB, STOOL: Occult Blood, Feces: NEGATIVE

## 2012-05-31 LAB — AMYLASE: Amylase: 17 U/L — ABNORMAL LOW (ref 25–115)

## 2012-06-02 LAB — CBC WITH DIFFERENTIAL/PLATELET
Basophil %: 0.6 %
Eosinophil #: 0.1 10*3/uL (ref 0.0–0.7)
Eosinophil %: 1.9 %
HCT: 34.4 % — ABNORMAL LOW (ref 40.0–52.0)
Lymphocyte %: 28.2 %
MCH: 28.1 pg (ref 26.0–34.0)
MCV: 86 fL (ref 80–100)
Monocyte %: 10.5 %
Neutrophil #: 2.5 10*3/uL (ref 1.4–6.5)
Neutrophil %: 58.8 %
Platelet: 172 10*3/uL (ref 150–440)
RDW: 14.1 % (ref 11.5–14.5)
WBC: 4.3 10*3/uL (ref 3.8–10.6)

## 2012-06-04 LAB — PATHOLOGY REPORT

## 2012-06-24 ENCOUNTER — Ambulatory Visit: Payer: Self-pay | Admitting: Unknown Physician Specialty

## 2012-06-25 ENCOUNTER — Ambulatory Visit: Payer: Self-pay | Admitting: Unknown Physician Specialty

## 2012-06-25 LAB — PATHOLOGY REPORT

## 2012-07-03 ENCOUNTER — Ambulatory Visit: Payer: Self-pay | Admitting: Unknown Physician Specialty

## 2013-10-10 DIAGNOSIS — N401 Enlarged prostate with lower urinary tract symptoms: Secondary | ICD-10-CM | POA: Insufficient documentation

## 2013-10-24 IMAGING — CT CT CHEST-ABD-PELV W/ CM
1 of 3 series · 12 of 30 positions shown, 18 images · non-contrast
Comparison: none

REASON FOR EXAM: (1) IV ONLY, GO W/O LABS; (2) CHEST, EPIGSTRIC PAIN,
EVAL AAA
COMMENTS:

PROCEDURE:     CT  - CT CHEST ABDOMEN AND PELVIS W  - April 20, 2012  [DATE]
RESULT:     Comparison: None.
TECHNIQUE: Multiple axial images were obtained of the chest, abdomen, and
pelvis, according to the CTA protocol, without oral contrast and after the
administration of 100 mL of 9sovue-8RC intravenous contrast. The images were
reviewed on a [employer] multiplanar work station.

[Series 9: a/p · axial · 0.97mm/px · z∈[-770,-320]mm · 12 of 180 slices shown, 18 images]
[im 15/180  mediastinal]
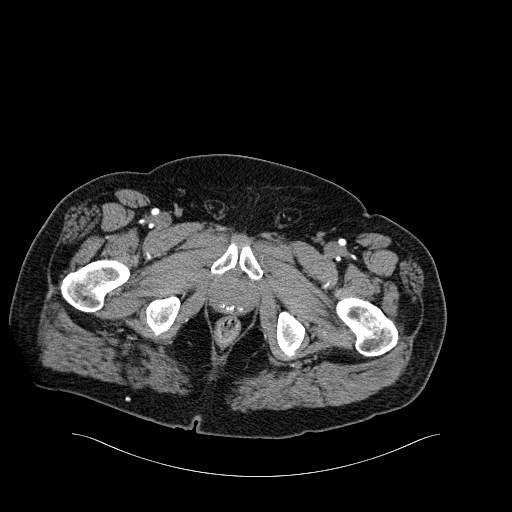
[im 15/180  bone]
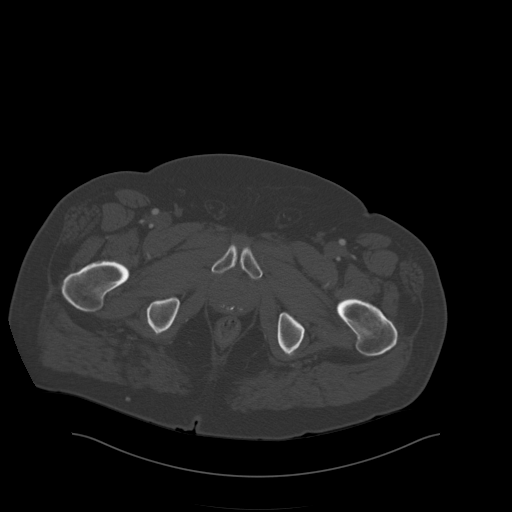
[im 30/180  mediastinal]
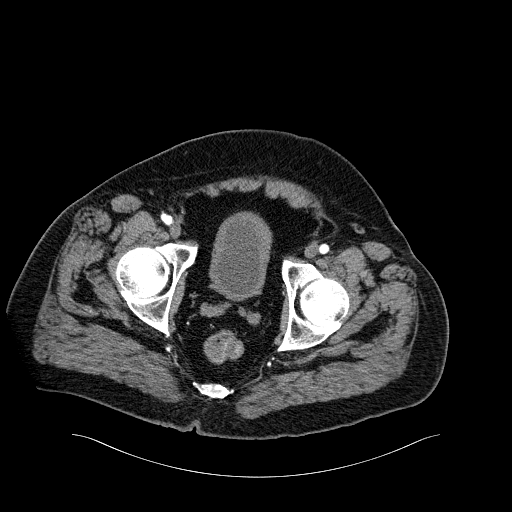
[im 45/180  mediastinal]
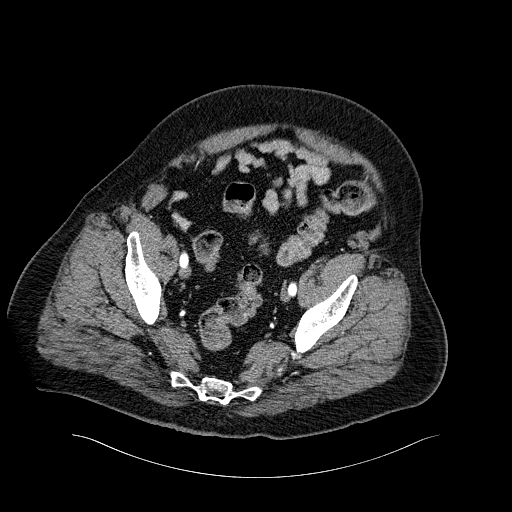
[im 60/180  mediastinal]
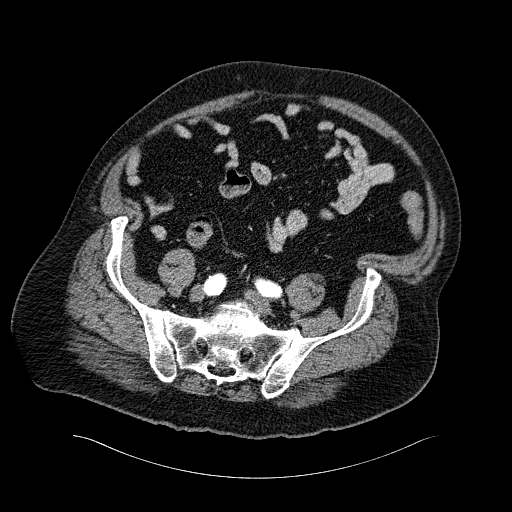
[im 75/180  mediastinal]
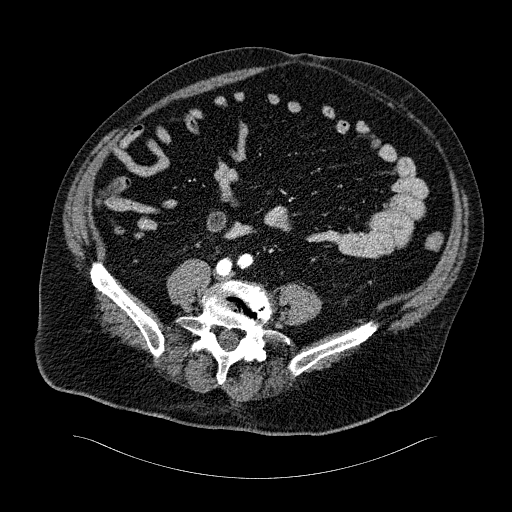
[im 90/180  mediastinal]
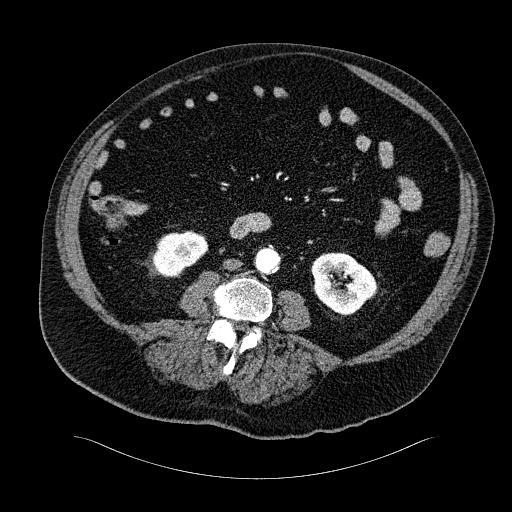
[im 105/180  mediastinal]
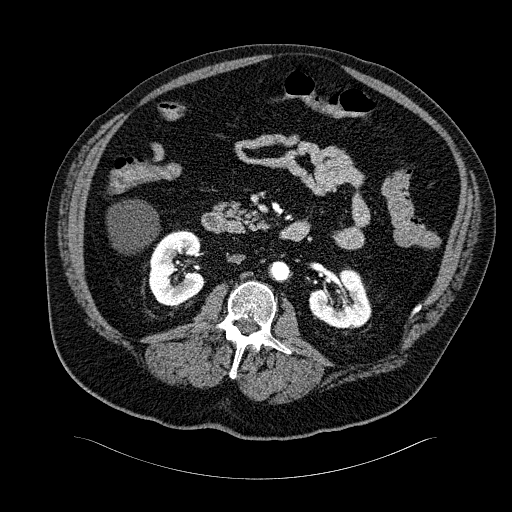
[im 114/180  mediastinal]
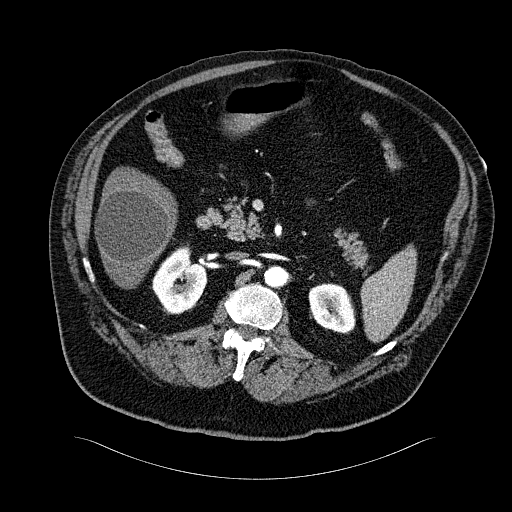
[im 120/180  mediastinal]
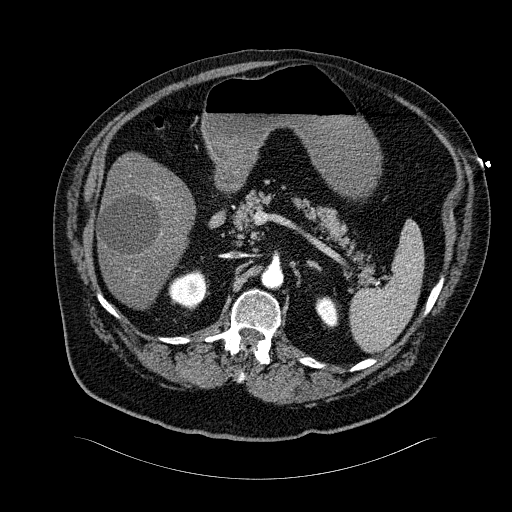
[im 120/180  lung]
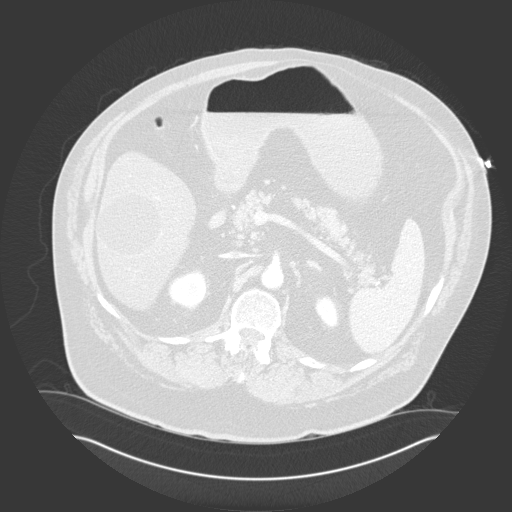
[im 120/180  bone]
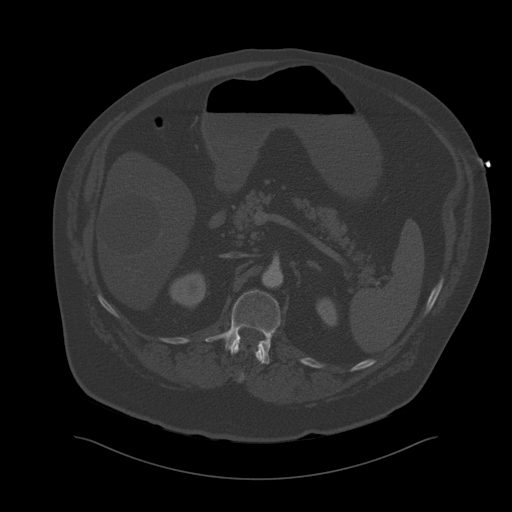
[im 135/180  mediastinal]
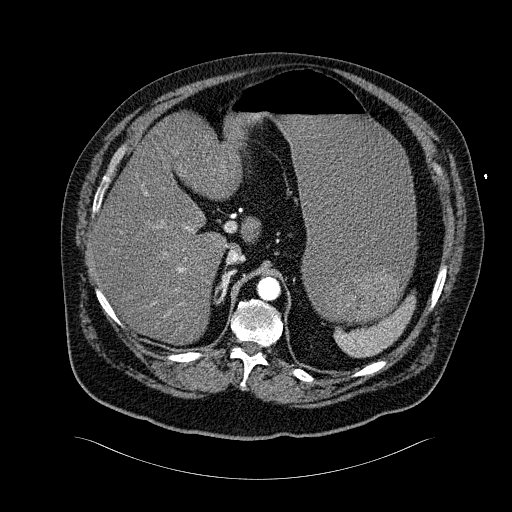
[im 135/180  lung]
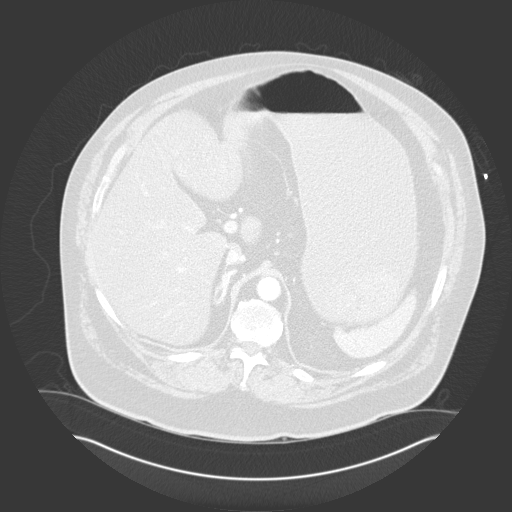
[im 150/180  mediastinal]
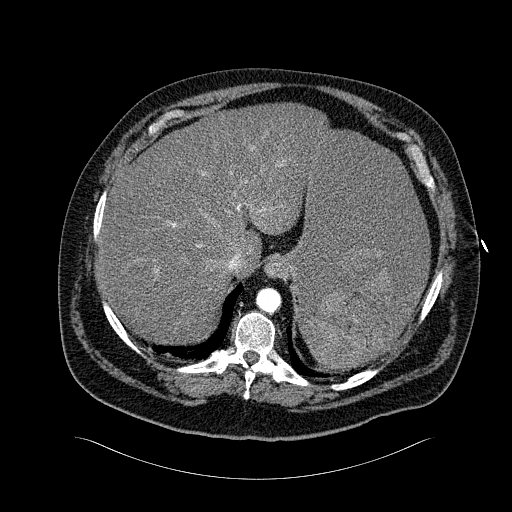
[im 150/180  lung]
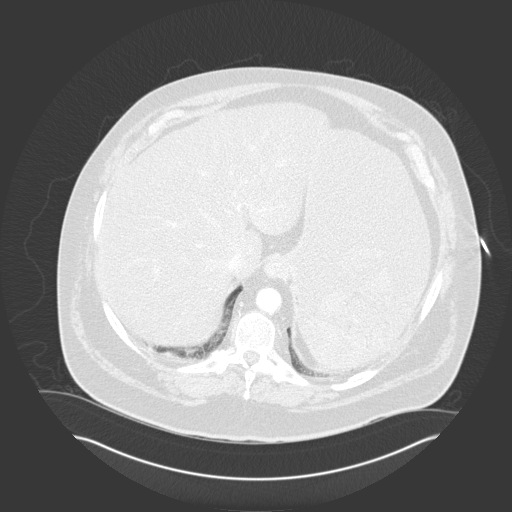
[im 165/180  mediastinal]
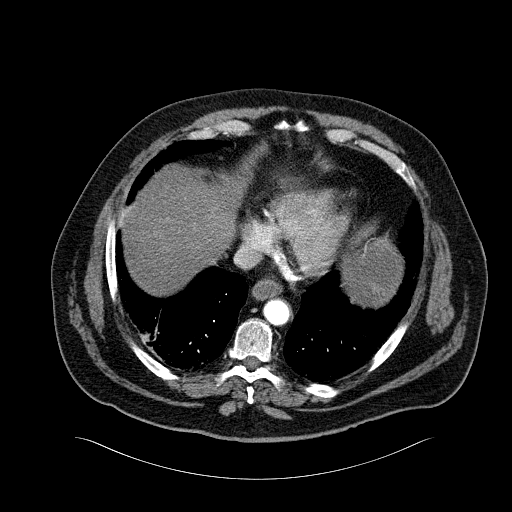
[im 165/180  lung]
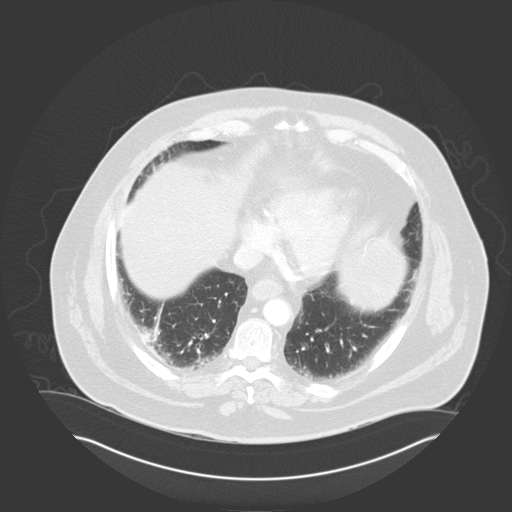

[12 of 30 positions shown; findings below may reference images not displayed]

FINDINGS: No mediastinal, hilar, or axillary lymphadenopathy. There is a mild amount
of fluid throughout the esophagus. This could be seen with reflux. The aorta
is normal in caliber. No aortic dissection seen. There is a 3 mm subpleural
nodule in the medial right upper lobe common image 31. There is an 8mm
nodule in the right middle lobe, image 65. There is a 4 mm nodule in the
right upper lobe. Mild basilar opacities are likely secondary to atelectasis.

Relative arterial phase of contrast material limits evaluation of the solid
organs. There is a 6.6 cm cyst in the inferior right hepatic lobe. Relative
of low-attenuation of the liver suggest hepatic steatosis. There is a
calcified gallstone in the region of the gallbladder neck. The spleen,
adrenals, and pancreas are unremarkable. Small low-attenuation foci in the
right kidney are too small to characterize. The stomach is mild to moderate
distended with fluid and ingested material. Heterogeneous density the in the
gastric antrum is likely related to ingested material.

The visualized small and large bowel are normal in caliber. Mild prominence
of the bladder wall is likely secondary to underdistention. The appendix is
normal. The comment iliac arteries are mildly enlarged, measuring 1.5 cm in
diameter.

No aggressive lytic or sclerotic osseous lesions are identified.
IMPRESSION: 1. No aortic aneurysm or aortic dissection seen. The common iliac arteries
are mildly enlarged.
2. Cholelithiasis.
3. The stomach is mild to moderately distended with fluid and ingested
material. Correlate for gastric outlet obstruction.
4. Indeterminate 8mm nodule in the right middle lobe. A followup noncontrast
chest CT is recommended in 3 months.

The preliminary report was provided to the Dr. Che Wah Billie shortly after the
examination.

## 2014-09-01 NOTE — Discharge Summary (Signed)
PATIENT NAME:  [name], [name] MR#:  [id number] DATE OF BIRTH:  [birth year]  DATE OF ADMISSION:  04/20/2012 DATE OF DISCHARGE:  04/25/2012   CONSULTANTS:  1. Dr. Vira Agar from GI  2. Dr. Ubaldo Glassing from Cardiology   3. Dr. Leanora Cover from General Surgery   CHIEF COMPLAINT: Diaphoresis and palpitations.   DISCHARGE DIAGNOSES:  1. Systemic inflammatory response syndrome.  2. Acute gastrointestinal bleed from bleeding gastric ulcer.  3. New diagnosis of diabetes.  4. Elevated troponin likely from hypertension and demand ischemia.  5. Transient hypotension.  6. 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  7. Obesity.   DISCHARGE MEDICATIONS:  1. Protonix 40 mg 2 times a day.  2. Tamsulosin 0.4 mg daily.  3. Zocor 40 mg daily.  4. Vitamin D2 50,000 international units weekly.  5. Lisinopril 10 mg daily.  6. Metformin 500 mg 2 times a day.   DO NOT TAKE: Stop taking aspirin and Coumadin for now.   DIET: Low sodium, consistent carb, diabetic diet, scrambled eggs, mashed potatoes, GI soft diet for the next 2 to 3 days and then advance as tolerated.   ACTIVITY: As tolerated.    DISCHARGE FOLLOW-UP:  1. Please follow-up with your primary care physician within 1 to 2 weeks.  2. Please follow-up with Dr. Percell [place] office early next week.  3. If any dark stools, bloody stools, worsening abdominal pain, shortness of breath, or any other concerns, call your primary care physician right away. 4. Check a CBC and a BMP within a week.   DISPOSITION: Home.   HISTORY OF PRESENT ILLNESS: For full details of history and physical, please see the dictation on 04/20/2012 by Dr. Margaretmary Eddy. Briefly, this is a pleasant 69 year old Caucasian male who presented with diaphoresis, palpitations with tachycardia and hypotension with systolic 71, diastolic 43. He did have positive guaiac stool and had acute renal failure. He was admitted to the hospitalist service for further evaluation and management with IV fluids and  Levophed to the CCU. GI was consulted and his blood pressure medications were held. He did have elevated INR in the setting of Coumadin therapy and he received FFP for INR reversal.   SIGNIFICANT LABS AND IMAGING: Initial BUN 21, creatinine 1.41, peak creatinine was 1.44, last creatinine 0.7. LFTs on arrival bilirubin 0.5, albumin 3.1, total protein 5.9. Initial troponin 0.02, then 0.06, then 0.06 which were flat. WBC 22.4, hemoglobin 14, lowest hemoglobin was 8.9, last hemoglobin 9.3. The patient did not require transfusion. Blood cultures from arrival no growth to date. Initial INR 2.7. Urinalysis not suggestive of infection.   Echocardiogram technically difficult study. Normal systolic function. EF greater than 55%.   CT of chest, abdomen, and pelvis with contrast showing no aortic aneurysm or aortic dissection, cholelithiasis, stomach mildly to moderately distended with fluid and ingested material, 8 mm nodule in the right middle lobe. Follow-up noncontrast CT is recommended in three months.   HOSPITAL COURSE: The patient was admitted to the hospitalist service in ICU. He was started on IV fluids and did require transient pressors and was taken off of it. He was seen by GI, Dr. Vira Agar, and underwent endoscopy on December 9th showing gastric ulcer with adherent clot. He was seen by Surgery as well by Dr. Leanora Cover. He has remained hemodynamically stable since. He has had occasional dark stools but no bright red blood and his hemoglobin, although it trended down, was above the threshold for transfusion and currently is trending back up. He was advised not to  take aspirin or Coumadin. He is to follow with Dr. Vira Agar from GI within one week for another CBC check. His hypotension has resolved and his blood pressure medications could be resumed. He did have a diagnosis of diabetes as he has been having hyperglycemia. His hemoglobin A1c was checked and is 7. Metformin has been started and he is tolerating it.  He should follow-up with his primary care physician. He will be discharged with a glucometer and test strips and lancets. His acute renal failure has resolved. That was likely in the setting of hypotension and acute GI bleed. He did have a bump in troponins and was seen by Cardiology and was thought to be hypotension and demand ischemia related. This is not a true MI. The patient also should undergo further imaging of the indeterminate nodule in the chest.   TOTAL TIME SPENT: 35 minutes.   CODE STATUS: The patient is FULL CODE.   ____________________________ Vivien Presto, MD sa:drc D: 04/25/2012 13:13:46 ET T: 04/25/2012 14:09:43 ET JOB#: [id number]  cc: Vivien Presto, MD, <Dictator> Manya Silvas, MD Karel Jarvis [facility] MD ELECTRONICALLY SIGNED 05/14/2012 14:08

## 2014-09-01 NOTE — Consult Note (Signed)
Pt with one stool today, black. rectal exam showed black stool very heme positive.  Discussed with Dr, Ubaldo Glassing and reversed his coumadin with vit K and 2 units of FFP.  Color looks good, VSS.  Will do EGD tomorrow morning.    Electronic Signatures: Manya Silvas (MD)  (Signed on 08-Dec-13 16:14)  Authored  Last Updated: 08-Dec-13 16:14 by Manya Silvas (MD)

## 2014-09-01 NOTE — Consult Note (Signed)
VSS afebrile, no abd pain, no nausea or vomiting, bun down to 9, hgb 9.2, off anticoagulation and normal PT.  Clear liquids today, full liquids tomorow, home Thursday on full liquids for a day then ultra soft diet for 4-5 days.  Avoid all NSAID, all alcohol.  Repeat EGD in 8-10 weeks to be sure gastric ulcer heals.  Electronic Signatures: Manya Silvas (MD)  (Signed on 10-Dec-13 10:54)  Authored  Last Updated: 10-Dec-13 10:54 by Manya Silvas (MD)

## 2014-09-01 NOTE — H&P (Signed)
PATIENT NAME:  [name], [name] MR#:  [id number] DATE OF BIRTH:  May 08, 1946  DATE OF ADMISSION:  04/20/2012  PRIMARY CARE PHYSICIAN:  Dr. Shepard [facility]   CHIEF COMPLAINT: Diaphoresis and palpitations.  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 The patient was in his usual state of health until this a.m. While he was on his bed he started sweating a lot. The patient went to the bathroom but he felt dizzy and at the same time he had drenching sweats. The patient was scared and went back to his bed. He waited for 30 minutes, but as the patient's heart was racing and he was still diaphoretic he called 911 and came to the ER. In the ER the patient was tachycardic, still with drenching sweats, and blood pressure was low with systolic at 71 and diastolic at 43 initially. His heart rate was at 116. Temperature was 95.4. EKG revealed sinus tachycardia at 125 beats per minute. The patient was complaining of vague epigastric discomfort. Immediate CT chest as well as CT of abdomen and pelvis were done in the ER. No evidence of pulmonary embolus and no evidence of thoracic aortic aneurysm or dissection. There was a right middle lobe subcentimeter nodule. CT of the abdomen and pelvis has revealed nonspecific enlargement of the prostate gland and moderate distention of the stomach with fluid and gas. Findings were suggesting questionable partial gastric outlet obstruction. The patient's stool for Hemoccult was positive in the ER. INR was at 2.7. Creatinine is elevated at 1.4. Blood cultures were obtained and the patient was  given Zosyn and vancomycin in the ER as his white count was elevated and patient's temperature was at 95.4. With blood pressure being persistently low, fluid boluses were given but still it was persistently low so the patient was started on Levophed and  TTT in the ER itself. Hospitalist team is called to admit the patient. During my examination, the patient is reporting that he is not feeling good.  He is  complaining of vague epigastric discomfort, but palpitations were resolved. Denies any chest pain. He has chronic history of cough, which he describes as smoker's cough, as he has been smoking for the past 50 years. No family members were at bedside. Denies any dizziness or loss of consciousness. He felt dizzy while he was at home while he was attempting to go to the bathroom, but the dizziness resolved while he was on the bed. Denies any vomiting of blood. Denies any similar complaints in the past. ABGs were done in the ER and the patient's pH was 7.37 with pulse oximetry 95%. Hemoglobin was at 14.  Typing and screening was also done in the ER as well.   PAST MEDICAL HISTORY:   1. The patient is just reporting deep vein thrombosis in the left lower extremity.  2. He denies any other medical problems, but on reviewing his home medication as the patient is on lisinopril I assume he has a chronic history of hypertension.  3. As the patient is on tamsulosin, I assume the patient has benign prostatic hypertrophy.   4. As the patient is  on a statin, I assume the patient has hyperlipidemia.   PAST SURGICAL HISTORY: Left leg surgery for deep vein thrombosis.   ALLERGIES: The patient is allergic to iodine.   HOME MEDICATION LIST: 1. Vitamin D 50,000 international units, 1 capsule every 7 days. 2. Tamsulosin 0.4 mg once a day.  3. Lisinopril 10 mg once a day.  4. Aspirin 81 mg once a day.  5. Coumadin 5 mg once a day.  6. Zocor 40 mg p.o. at bedtime.     PSYCHOSOCIAL HISTORY: Lives at home, lives  alone. He smoked 2 packs a day for the past 50 years and quit smoking on Thanksgiving day. Admits that he drinks 2 to 3 beers in a week. Denies any illicit drug usage.   FAMILY HISTORY: Father died, had history of stroke as well as heart problems. Mother was alive until age 59.     REVIEW OF SYSTEMS: CONSTITUTIONAL: The patient's temperature is low. Actually he is hypothermic with a temperature of 95.4. Denies any weakness, weight loss, or weight gain. EYES: Denies blurry vision, pain, redness, inflammation, glaucoma, or cataracts. ENT: Denies tinnitus, ear pain, hearing loss, snoring, postnasal drip, sinus pain, dentures, or difficulty in swallowing. RESPIRATORY: Chronic cough as he is a smoker. Denies any wheezing, hemoptysis, dyspnea, asthma, or pneumonia. CARDIOVASCULAR: Denies any chest pain, orthopnea, or edema. Complaining of palpitations. He felt dizzy. Denies any varicose veins.  GI: Denies nausea, vomiting, or diarrhea. Complaining of epigastric abdominal pain. Denies hematemesis, melena, change in bowel habits, or rectal bleeding.  GU: Denies any dysuria or hematuria. ENDOCRINE: Denies polyuria, polyphagia, polydipsia, or thyroid problems. Positive increased sweating. HEMATOLOGIC/LYMPHATIC: Denies anemia, easy bruising, bleeding, or swollen glands. INTEGUMENT: Denies acne, rash, or lesions. MUSCULOSKELETAL: Denies any pain in the back, neck, or shoulder. Denies any arthritis. Denies any limitation of activity. NEUROLOGIC: Denies any numbness, weakness, vertigo, ataxia, seizures, or memory loss. PSYCH: Denies any insomnia, nervousness, schizophrenia, or depression.   PHYSICAL EXAMINATION:  VITAL SIGNS: Temperature 95.4, pulse initially 116, fluctuating between 106 and 116, respiratory rate 19 to 22, blood pressure 71/42. Systolic blood pressure is fluctuating between 51 and 82, diastolic between 39 and 45. Pulse oximetry 96% on 2 liters.   GENERAL APPEARANCE: Well built, well nourished, obese.  Not in acute distress.  HEENT: Normocephalic, atraumatic. Pupils are equally reactive to light and accommodation. Tympanic membranes are intact. Nares are not congested. No postnasal drip. Oropharynx no abscess, no plaques identified. Somewhat dry mucous membranes.   NECK: Supple. No JVD. No thyromegaly. No lymphadenopathy. No carotid bruits.   LUNGS: Clear to auscultation bilaterally. No wheezing. No crackles. No chest wall tenderness. No accessory muscle use.   CARDIOVASCULAR: Tachycardic, regular rate and rhythm. No murmurs. No peripheral edema. Distal pulses are 2+.   GI: Soft. Diffuse epigastric tenderness is present but no rebound tenderness. Bowel sounds are positive in all four quadrants Central adiposity is present. No masses felt.   NEUROLOGIC: Awake, alert, and oriented times three. Motor and sensory are grossly intact. Cranial nerves II through XII are intact. Reflexes are 2+.   EXTREMITIES: No edema. No cyanosis. No clubbing.   SKIN: No rashes. No lesions. No jaundice. Diaphoretic. Turgor is intact.   PSYCH: Mood and affect are normal.   MUSCULOSKELETAL: Denies any pain, denies any swelling, erythema, or tenderness in the joints. Normal range of motion.   LABS AND IMAGING STUDIES: CT angiogram of the chest has revealed no CT evidence of pulmonary embolism, no evidence of thoracic  aortic aneurysm or dissection. Left ventricular wall hypertrophy, consider hypertrophic cardiomyopathy, lipomatous hypertrophy of the internal septum, which usually is asymptomatic. Subcentimeter right middle lobe nodule. No remote/prior studies are available for comparison.   CT of the abdomen and pelvis has revealed moderate distention of the stomach with fluid and gas, possible wall thickening in the distal antrum. Cholelithiasis.  Hepatic cyst. Findings questionable for partial gastric outlet obstruction versus gastroparesis. Bladder wall thickening which may be related to incomplete distention,  chronic outlet obstruction, or cystitis. Nonspecific enlargement of the prostate gland. Clinical correlation is recommended.  Glucose 353, BUN 21, creatinine 1.41, sodium 140, potassium 5.1, chloride 104, CO2 26, GFR greater than 60, anion gap is 10, serum osmolality 297, calcium total 8.4, lipase 202, total protein 5.9, albumin 3.1, total bilirubin 4.5, alkaline phosphatase 74, AST 34, ALT 48, troponin I 0.02. WBC 22.4, hemoglobin is 14, hematocrit 40.9, platelet count 305,000, MCV 90, MCH 30.7, neutrophils 16.9, lymphocytes 30.7. The patient is O-, antibody negative, PT 29.1, INR 2.7.   Urinalysis:  Turbid in clarity. Glucose 60 mg/dL, ketones negative. Specific gravity greater than 1.06, pH 5, blood 2+, protein 100 mg/dL, nitrite and leukocyte esterase negative.    ABG:  pH 7.37, pCO2 36, pO2 79, FiO2 28%, bicarbonate 20.8, and base excess 3.9. Lactic acid 4.9.    ASSESSMENT AND PLAN: 69 year old Caucasian male with past medical history of lower extremity deep vein thrombosis, on Coumadin, who comes into the ER with a chief complaint of epigastric abdominal pain, palpitations, and diaphoresis.  Hemoccult was positive in the ER. He will be admitted with the following assessment and plan.  1. Systemic inflammatory response syndrome: We will admit the patient to the Critical Care Unit and make him n.p.o. IV fluid boluses were given. We will continue IV fluids at 125 mL per hour.   2. Levophed GTT to maintain MAP of 65. The patient will be on proton pump inhibitor. We will cover him with IV antibiotics Zosyn and vancomycin empirically. Blood cultures were obtained in the ER.  3. Epigastric abdominal pain with hematochezia: ? gastroparesis (positive bowel sounds in all four quadrants during my examination). The patient is not nauseated or vomiting. Plan- we will keep him n.p.o.  We will continue IV fluids with Levophed GTT with goal MAP of 65.  Proton pump inhibitor.  Hold Coumadin. GI consult. If  necessary, consider NG tube for decompression and surgical consult.  4. Acute renal insufficiency: The patient is on IV fluids. Avoid nephrotoxins.  Holding lisinopril in view of hypotension.  5. Hyperglycemia and glucosuria: Questionable new onset diabetes mellitus. The patient will be on insulin sliding scale. We will check hemoglobin A1c.  6. Hypertension: Nullified in view of systemic inflammatory response syndrome and hypotension. In fact, the patient is on Levophed GTT. We will hold his blood pressure medication lisinopril.  7. Pulmonary nodule on CT chest: The patient would benefit from a repeat CT chest in 3 to 6 months for further evaluation.  8. Chronic history of lower extremity deep vein thrombosis: We will hold off on Coumadin. The patient's INR is therapeutic at this time. If the patient starts bleeding actively we will consider giving him vitamin K and fresh frozen plasma.  9. GI prophylaxis is provided with Protonix 40 mg IV q.12 hours.  10. Deep vein thrombosis prophylaxis with SCDs.  11. The patient is FULL CODE.  12. Condition is critical.  The patient does not have any medical POA as he lives alone.  The diagnosis and plan of care was discussed in detail with the patient. He is aware of the plan.   TOTAL CRITICAL CARE TIME SPENT:  75 minutes.   ____________________________ Nicholes Mango, MD ag:bjt D: 04/20/2012 07:14:45 ET T: 04/20/2012 09:30:47 ET JOB#: [id number]  cc: Nicholes Mango, MD, <Dictator> Nelda Severe. Burt Ek, MD Nicholes Mango MD ELECTRONICALLY SIGNED 04/21/2012 0:43

## 2014-09-01 NOTE — Consult Note (Signed)
General Aspect Consulted per GI to see patient with admission for weakness, fatigue, dark tarry stools and hypotension. Note to have mild troponin elevations to 0.06 with normal cpk/mb. Pt states he had prior pci both at [facility] and [place]. He also has a history of dvt and is currently on coumadin for this. The dvt occurred approximately 4 years ago and he has been on coumadin ever since. On admission his inr was 2.7. He states he had a bloody stool at home and had at least one since admission. His hemoglobin was greater than 14. He remains hypotensive and curently is on levophedra for blood pressure support. He complains of a distended abdomen but denies chest pain. He has been compoliant with his medicaitons.Abdominal ct revealed no acute proces other than cholelithiasis.   Physical Exam:   GEN obese    HEENT PERRL, hearing intact to voice    NECK supple  No masses    RESP normal resp effort  no use of accessory muscles    CARD Regular rate and rhythm  Tachycardic    ABD positive tenderness  no liver/spleen enlargement  no hernia  no Abdominal Bruits    LYMPH negative neck, negative axillae    EXTR negative cyanosis/clubbing, negative edema    SKIN normal to palpation    NEURO cranial nerves intact, motor/sensory function intact    PSYCH alert, poor insight   Review of Systems:   Subjective/Chief Complaint abdominal distension and bloody stols    General: Fatigue    Skin: No Complaints    ENT: No Complaints    Eyes: No Complaints    Neck: No Complaints    Respiratory: No Complaints    Cardiovascular: No Complaints    Gastrointestinal: Black tarry stools    Genitourinary: No Complaints    Vascular: No Complaints    Musculoskeletal: No Complaints    Neurologic: No Complaints    Hematologic: No Complaints    Endocrine: No Complaints    Psychiatric: No Complaints    Review of Systems: All other systems were reviewed and found to be negative     Medications/Allergies Reviewed Medications/Allergies reviewed     dvt:    Hypercholesterolemia:    headaches:   Home Medications: Medication Instructions Status  aspirin 81 mg oral tablet, chewable 1 tab(s) orally once a day Active  Coumadin 5 mg oral tablet 1 tab(s) orally once a day take 1 1/2 tablet four days a week Active  tamsulosin 0.4 mg oral capsule 1 cap(s) orally once a day Active  Zocor 40 mg oral tablet 1 tab(s) orally once a day (at bedtime) Active  Vitamin D2 50,000 intl units (1.25 mg) oral capsule 1 cap(s) orally every 7 days Active  lisinopril 10 mg oral tablet 1 tab(s) orally once a day Active   EKG:   EKG NSR    Abnormal NSSTTW changes  sinus tachycardia    Iodine: Swelling, Hives, Rash    Impression 69 yo male with 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 He also has a history of dvt approximately 4 years ago and has remianed on coumadin since then. He is admitted with ypotension, diapharesis and hitory of bloody stools with tarry stools as well. His hemoglobin was 14. He was placed on iv fluids and levophed for blood pressure surrot. He deneis chest pain but has abdominal discomfort. His cardiac markers were mildly elevted to a tropoinin of 0.06. He had an inr of 2.7.  Would agree with holding coumadin and asa for now as pt is exhibiting evidence of gi bleed. Pt also has cholelithiasis per abdominal ct. Would not add beta bloders or asa now given gi bleed and minimal tropnin elevation. Continue to wean levophed and hydrate. Will review echo when available. Does not appear to be having an acute mi as primary event.    Plan 1. Agree with holding coumadin and asa 2. Rule out for mi 3. Await echo 4. Iv fluids and wean levophed as soon as possible 5. Avoid beta blockers at present given hypotension . 6. Will follow with you.   Electronic Signatures: Teodoro Spray (MD)  (Signed 07-Dec-13 16:53)  Authored: General Aspect/Present Illness, History and  Physical Exam, Review of System, Past Medical History, Home Medications, EKG , Allergies, Impression/Plan   Last Updated: 07-Dec-13 16:53 by Teodoro Spray (MD)

## 2014-09-01 NOTE — Consult Note (Signed)
Brief Consult Note: Diagnosis: Prepyloric ulcer bleeding, anti-coagulation (reversed).   Patient was seen by consultant.   Comments: I met patient and explained we are here if he rebleeds. No previous abdominal surgery, but obese. Let me know if I can assist with anything, otherwise I will follow during his hospitalization.  Electronic Signatures: Consuela Mimes (MD)  (Signed 09-Dec-13 14:08)  Authored: Brief Consult Note   Last Updated: 09-Dec-13 14:08 by Consuela Mimes (MD)

## 2014-09-01 NOTE — Consult Note (Signed)
Pt with black stools, last one yesterday.  Off levophed, hgb down from 14 to 10.5, no prior ulcer.  on coumadin with therapeutic level on admission.  No hematemesis,  exam shows good color to skin, no paleness of tongue or conjunctiva. Chest clear, abd distended.  Will plan EGD tomorrow, give FFP today and vit K one dose.  follow hgb.  Electronic Signatures: Manya Silvas (MD)  (Signed on 08-Dec-13 09:11)  Authored  Last Updated: 08-Dec-13 09:11 by Manya Silvas (MD)

## 2014-09-01 NOTE — Consult Note (Signed)
Hgb 9.6, plt ct normal, WBC normal.  EGD done for UGI bleeding showed a fresh clot in a prepyloric ulcer which was treated with epinephrine, bipolar probe, and clips.  No bleeding at end of procedure.  No other bleeding areas.  I will consult surgery hospitalist in case there is a return of bleeding in which case he will go to surgery.  Pt tolerated procedure well.   Electronic Signatures: Manya Silvas (MD)  (Signed on 09-Dec-13 09:36)  Authored  Last Updated: 09-Dec-13 09:36 by Manya Silvas (MD)

## 2014-09-01 NOTE — Consult Note (Signed)
Pt CC: gi bleed from gastric ulcer.  Hgb stable 8.9, color good. tol full liquids well.  i think he can go home tomorrow on full liquids and mashed potatoes/scrambled eggs for a few days and see me next week on Tuesday or Thursday.  Do not start iron yet until more time to heal a bit. Hold asprin and I will start it in a week.  Electronic Signatures: Manya Silvas (MD)  (Signed on 11-Dec-13 17:31)  Authored  Last Updated: 11-Dec-13 17:31 by Manya Silvas (MD)

## 2014-09-01 NOTE — Consult Note (Signed)
PATIENT NAME:  [name], [name] MR#:  [id number] DATE OF BIRTH:  [birth year]  DATE OF CONSULTATION:  04/20/2012  REFERRING PHYSICIAN:   CONSULTING PHYSICIAN:  Manya Silvas, MD  HISTORY OF PRESENT ILLNESS Patient is a 69 year old white male who was admitted to the hospital because of a spell of hypotension, diaphoresis and dizziness and tachycardia. When he presented to the ER he had a blood pressure of 70/43, heart rate of 116, complaining of vague epigastric discomfort. A CT of the abdomen and pelvis was done and showed moderate distention of the stomach with fluid and gas. For his hypotension he was given fluid boluses and started on Levophed and was admitted to the Intensive Care Unit. I was asked to see him in consultation today.   Patient had apparently a stool in the ER and this was guaiaced and was found to be positive. The patient says he has been passing black stools the day before admission. This is a new onset problem. He says he had pain across the upper abdomen for 5 or 10 minutes with diaphoresis but he did not have any vomiting but he says his mouth tasted like blood. He takes Aleve once or twice a week.   He has never had an upper endoscopy or colonoscopy. No prior GI bleeding. He denies taking any Pepto-Bismol or iron.   PAST MEDICAL HISTORY: He has had a clot in the artery of the left lower extremity and had thrombectomy. He had had two cardiac catheterizations, the last one was four years ago, supposedly these were negative. He had a nuclear stress test two years ago, this supposedly was negative.   HABITS: Smoked 2 packs a day for 50 years. He also admits to drinking a few beers a week.  ALLERGIES: Iodine.   HOME MEDICATIONS:  1. Vitamin D 50,000 unit 1 capsule every week. 2. Tamsulosin 0.4 mg once a day.  3. Lisinopril 10 mg once a day.  4. Aspirin 81 mg once a day.  5. Coumadin 5 mg a day.  6. Zocor 40 mg a day.    FAMILY HISTORY: Father had heart problems and a stroke.  Mother alive at age 62.   REVIEW OF SYSTEMS: He has a chronic cough. No wheezing. No coughing up blood. No dysphagia. No hematemesis. No bright red blood per rectum. No pain or burning with urination.   PHYSICAL EXAMINATION:  GENERAL: White male in no acute distress who is on Levophed.   HEENT: Sclerae nonicteric. Conjunctivae negative. Tongue shows no pallor.   CHEST: Clear anterolateral fields.   HEART: No murmurs or gallops I can hear.   ABDOMEN: Distended, fair amount of air noted on percussion in the upper abdomen. The abdomen shows minimal tenderness to deep palpation. No hepatosplenomegaly.   EXTREMITIES: The left thigh old scar from previous surgery.   SKIN: Shows no jaundice. No rash. No diaphoresis at this time.   LABORATORY, DIAGNOSTIC AND RADIOLOGICAL DATA: CT angiogram of the chest showed no CT evidence of pulmonary embolism or aneurysm dissection. Does show left ventricular hypertrophy, subcentimeter right middle lobe nodule. CT abdomen and pelvis showed moderate distention of the stomach with fluid present and gas, possible wall thickening in the distal antrum. Does have cholelithiasis, possible gastric outlet obstruction versus gastroparesis   Glucose 353, BUN 21, creatinine 1.4, sodium 140, potassium 5.1. EKG is unusual, shows significant left axis deviation, rule inferior old inferior MI, rule out old lateral MI. Total protein 5.9, albumin 3.1, total bilirubin 0.5, alkaline  phosphatase 74, SGOT 34, SGPT 48. Troponin initially 0.02, repeat 0.06. Hemoglobin A1c 7, calcium 8.4, glucose 353, BUN 21, creatinine 1.41, sodium 140, potassium 5.1, chloride 104. Arterial blood gas pH 7.37, pCO2 36, pO2 79. Lactic acid is 4.9. White blood count 22.4, hemoglobin 14, hematocrit 40.9.   ASSESSMENT: Acute onset of hypotension in an obese white male with diaphoresis, apparently undiagnosed diabetes who also had significant lactate production was noted to have gastric distention and  cholelithiasis and an abnormal EKG. I am concerned about the possibility of underlying cardiac disease and possibly this representing a spell of angina. His color looks good. His hemoglobin is normal. He possibly could have had some degree of bleeding enough to give black stools and heme positive. He does have gallstones. It is possible he could have passed a stone giving him some of the above symptoms.   RECOMMENDATIONS:  1. Would continue IV Protonix 40 b.i.d.  2. Would get cardiology consult for his clinical presentation as well as his abnormal EKG. Would hold Coumadin for now in face of possible early GI bleed. He may eventually need upper endoscopy. At the moment want his cardiac worked up first. Will follow with you.   ____________________________ Manya Silvas, MD rte:cms D: 04/20/2012 14:52:01 ET T: 04/20/2012 15:45:19 ET JOB#: [id number]  cc: Manya Silvas, MD, <Dictator> Manya Silvas MD ELECTRONICALLY SIGNED 04/27/2012 11:57

## 2014-09-04 NOTE — Consult Note (Signed)
Pt CC: abd pain.  He is feeling better after surgery, epigastric pain gone.  He also has been on reglan iv around the clock.  Discussed with Dr. Bridgett Larsson and he can go home on oral Reglan and follow up with me on Feb 10th in office. VSS afebrile.  Electronic Signatures: Manya Silvas (MD)  (Signed on 19-Jan-14 09:36)  Authored  Last Updated: 19-Jan-14 09:36 by Manya Silvas (MD)

## 2014-09-04 NOTE — H&P (Signed)
PATIENT NAME:  [name], [name] MR#:  [id number] DATE OF BIRTH:  [birth year]  DATE OF ADMISSION:  05/29/2012  PRIMARY CARE PHYSICIAN:  Dr. Heber Onset.  REFERRING PHYSICIAN:  Dr. Vira Agar.  CHIEF COMPLAINT: Abdominal pain with nausea and vomiting today.   HISTORY OF PRESENT ILLNESS: The patient is a 69 year old Caucasian male with a history of GI bleeding with PUD diagnosed last December, hypertension, diabetes, hyperlipidemia, BPH, left leg atherothrombosis, was sent direct to the medical floor for direct admission by Dr. Tiffany Kocher. The patient is alert, awake, oriented, in no acute distress. The patient said he started to have abdominal pain since last night, which is cramping, intermittent, no radiation. The interval is about 5 minutes. He could not eat this morning. He developed nausea and vomiting about 4:00 am so he called Dr. Vira Agar and was sent to the hospital for direct admission. The patient had a left leg thrombosis, was on Coumadin last year. Since the patient developed GI bleeding with PUD last December, Coumadin was stopped. The patient has been off Coumadin and aspirin since last December. The patient denies any NSAID intake, denies any melena or bloody stool. No dizziness, headache or weakness. No diarrhea.   PAST MEDICAL HISTORY: GI bleeding, PUD, hypertension, diabetes, hyperlipidemia, BPH, left leg atherothrombosis.   SOCIAL HISTORY: No smoking, no drinking, no illicit drugs.   PAST SURGICAL HISTORY: Left leg surgery for thrombosis.   ALLERGIES: IODINE.   FAMILY HISTORY: Father had a stroke and heart problem.   HOME MEDICATIONS: Vitamin D 250,000 international units 1 cap every 7 days, Flomax 1 cap daily, Protonix 40 mg p.o. b.i.d., metformin 500 mg p.o. b.i.d.   REVIEW OF SYSTEMS: CONSTITUTIONAL: Patient denies any fever or chills. No headache or dizziness or weakness.  EYES: No double vision or blurry vision.  ENT: No postnasal drip, slurred speech or dysphagia.  CARDIOVASCULAR:  No chest pain, palpitations, orthopnea or nocturnal dyspnea. No leg edema.  GASTROINTESTINAL: Positive for abdominal pain, nausea, vomiting but no diarrhea, melena or bloody stool.  GENITOURINARY: No dysuria, hematuria or incontinence.  SKIN: No rash or jaundice.  HEMATOLOGIC: No easy bruising or bleeding.  ENDOCRINE: No polyuria, polydipsia, heat or cold intolerance.  NEUROLOGIC: No syncope, loss of consciousness or seizure.   PHYSICAL EXAMINATION:  VITAL SIGNS: Temperature 98.1, blood pressure 123/76, respirations 18, pulse 66, O2 saturation 95% on room air.  GENERAL: The patient is alert, awake, oriented, in no acute distress.  HEENT: Pupils round, equal, reactive to light and accommodation. Moist oral mucosa. Clear oropharynx.  NECK: Supple. No JVD or carotid bruit. No lymphadenopathy. No thyromegaly.  CARDIOVASCULAR: S1, S2 regular rate and rhythm. No murmurs or gallops.  PULMONARY: Bilateral air entry. No wheezing or rales. No use of accessory muscles to breathe.  ABDOMEN: Soft, obese. Bowel sounds present. Tenderness in upper abdominal area, especially epigastric area. No rigidity, no rebound. Bowel sounds present. No obvious organomegaly.  EXTREMITIES: No edema, clubbing or cyanosis. No calf tenderness. Strong bilateral pedal pulses.  SKIN: No rash or jaundice.  NEUROLOGIC: Alert and oriented x 3. No focal deficits. Power 5/5. Sensation intact.  LABORATORY DATA: Pending. It was just ordered (CBC, BMP and troponin level).   IMPRESSION:  1.  Epigastric pain, possibly due to acute gastritis or peptic ulcer disease.  2.  Hypertension.  3.  Diabetes.  4.  Hyperlipidemia.  5.  Benign prostatic hypertrophy.   PLAN OF TREATMENT:  1.  The patient will be placed for observation. We will keep n.p.o.  except medications and give IV fluid support. Continue Protonix 40 mg p.o. b.i.d. In addition, we will give Zofran and morphine p.r.n. for abdominal pain, nausea, vomiting.  2.  We will follow  up CBC and BMP and we will check stool occult. Follow up with Dr. Vira Agar for further evaluation and recommendation.  3.  For diabetes, we will start sliding scale. Continue metformin.   Discussed the patient's situation and plan of treatment with the patient.   TIME SPENT: About 55 minutes.   ____________________________ Demetrios Loll, MD qc:jm D: 05/29/2012 13:57:00 ET T: 05/29/2012 14:28:14 ET JOB#: [id number]  cc: Demetrios Loll, MD, <Dictator> Demetrios Loll MD ELECTRONICALLY SIGNED 05/30/2012 15:34

## 2014-09-04 NOTE — Discharge Summary (Signed)
DATE OF BIRTH:  1945-05-23  PRIMARY CARE PHYSICIAN: Dr. Heber [place].   CONSULTATION:  GI, Dr. Vira Agar; general surgery, Dr. Leanora Cover.  DISCHARGE DIAGNOSES:  Cholelithiasis, gastroparesis, hypertension, diabetes, fatty liver, hepatitic cyst.   PROCEDURE:  Laparoscopy, cholecystectomy, EGD.   SIGNIFICANT FINDINGS:  Gastroparesis, cholelithiasis.   CONDITION:  Stable.   CODE STATUS:  FULL CODE.   HOME MEDICATIONS:  Flomax 0.4 mg p.o. daily, metformin 500 mg p.o. b.i.d., Protonix 40 mg p.o. b.i.d., vitamin D 250,000 international units 1 cap every 7 days, Reglan 10 mg 4 times a day before meals and at bedtime.   DIET:  Low sodium, low fat, low cholesterol, ADA diet.   ACTIVITY:  As tolerated.   FOLLOWUP CARE:  Follow up with PCP within 1 to 2 weeks. Follow up with Dr. Vira Agar and Dr. Leanora Cover within 1 to 2 weeks.   REASON FOR ADMISSION:  Abdominal pain with nausea/vomiting.   HOSPITAL COURSE:  The patient is a 69 year old Caucasian male with a history of GI bleeding with PUD diagnosed last December, hypertension, diabetes, hyperlipidemia, BPH; was sent to the hospital by Dr. Vira Agar for direct admission due to abdominal pain and nausea/vomiting. For detailed history and physical examination, please refer to the admission note dictated by me. On admission date, the patient's WBC 12.7, hemoglobin 14, platelets 236. Electrolytes are normal. BUN 7, creatinine 0.94. After admission, the patient was kept n.p.o. with the Reglan and pain medication. Dr. Vira Agar did an EGD, which showed severe gastroparesis. Since the patient had right upper quadrant pain, the patient got ultrasound of the abdomen, which showed cholelithiasis. Dr. Vira Agar requested a surgery consult, and Dr. Leanora Cover did a laparoscopy cholecystectomy yesterday.   For gastroparesis, Dr. Vira Agar suggested IV Reglan 4 times a day. Since the patient's symptoms have much improved, Dr. Vira Agar suggested change to p.o. Reglan before meals and at  bedtime and to follow up with him as outpatient. The patient is clinically stable and will be discharged to home today. I discussed the patient's discharge plan with the patient and Dr. Vira Agar.   TIME SPENT:  About 33 minutes.    ____________________________ Demetrios Loll, MD qc:ms D: 06/02/2012 12:48:36 ET T: 06/02/2012 18:34:35 ET JOB#: [id number]  cc: Demetrios Loll, MD, <Dictator> Demetrios Loll MD ELECTRONICALLY SIGNED 06/04/2012 16:24

## 2014-09-04 NOTE — Op Note (Signed)
PATIENT NAME:  [name], [name] MR#:  [id number] DATE OF BIRTH:  [birth year]  DATE OF PROCEDURE:  06/01/2012  PREOPERATIVE DIAGNOSIS: Symptomatic cholelithiasis.   POSTOPERATIVE DIAGNOSIS: Symptomatic cholelithiasis.   SURGEON: Consuela Mimes, M.D.   ANESTHESIA: General.   OPERATION PERFORMED:  Laparoscopic cholecystectomy.  PROCEDURE IN DETAIL: The patient was placed supine on the Operating Room table and prepped and draped in the usual sterile fashion. A 15 mmHg CO2 pneumoperitoneum was created via a Veress needle in the supraumbilical midline and this was converted to a 5 mm trocar and a 30 degrees angled laparoscope. The remaining trocars were placed under direct visualization. The fundus of the gallbladder was retracted superiorly and ventrally. The gallbladder dissection was begun in the lower third of the gallbladder and a lot of fatty tissue was stripped off of the infundibulum, which was then retracted laterally, opening up the triangle of Calot. The cystic artery was triply clipped way up to the gallbladder and divided, and then the cystic duct infundibular junction was identified, and the cystic duct was triply clipped and divided, and the gallbladder was removed from the liver bed with electrocautery and about two-thirds of the way up to the liver bed towards the fundus, there was a structure that was potentially a duct of Luschka and therefore it was doubly clipped and divided. The gallbladder was placed in an Endo Catch bag and extracted from the abdomen via the epigastric port. This port site fascia was closed with a single 0 Vicryl suture using the laparoscopic puncture closure device, and the right upper quadrant was irrigated with copious amounts of warm normal saline. This was suctioned clear and reinspection showed the clips to be secure and hemostasis to be excellent. Therefore, the peritoneum was desufflated and decannulated and all four skin sites were closed        with  subcuticular 5-0 Monocryl and suture strips. The patient tolerated the procedure well. There were no complications.  ____________________________ Consuela Mimes, MD wfm:jm D: 06/01/2012 10:22:02 ET T: 06/01/2012 12:40:21 ET JOB#: [id number]  cc: Consuela Mimes, MD, <Dictator> Consuela Mimes MD ELECTRONICALLY SIGNED 06/02/2012 9:03

## 2014-09-04 NOTE — Consult Note (Signed)
Due to pain and vomiting and previous gall stone and liver cyst I think he should be converted to admission.  He may need iv reglan for a few days to see if his severe gastroparesis can be helped.  If this does not work he may need a Counsellor at [facility] in [place].  Electronic Signatures: Manya Silvas (MD)  (Signed on 16-Jan-14 18:32)  Authored  Last Updated: 16-Jan-14 18:32 by Manya Silvas (MD)

## 2014-09-04 NOTE — Consult Note (Signed)
Pt with gastroparesis on EGD with 2000cc of stomach fluid present, no motility at all in the stomach.  Will start on Reglan iv and change to oral. may go home tomorrow.  Area of previous ulcer healed very well.  Discussed with patient.  Electronic Signatures: Manya Silvas (MD)  (Signed on 16-Jan-14 13:30)  Authored  Last Updated: 16-Jan-14 13:30 by Manya Silvas (MD)

## 2014-09-04 NOTE — Consult Note (Signed)
CC: abd pain in epigastric area and vomiting.  Pt well known to me with previous antral/prepyloric ulcer with active bleeding who had EGD with treatment with injection, cauterization and clips  in early Dec 2013.  He had an attack of acute abd pain in general epigastric area and vomiting, similar to one a week or two after discharge for which he was seen in office.  He was due for repeat EGD but decision made to have him admitted for observation due to severity of symptoms and recurrent attacks despite appropriate medication.  He has been on soft diet and has quit smoking.    Pt with diagnosis of diabetes in last year, has intact gall bladder.  Exam today shows patient at this time in NAD, abd obese, no palpable masses, no HSM, no signif epigastric tenderness.  Chest clear.  VSS afebrile.  WBC up slightly. is to do EGD tomorrow, if neg consider [place] and HIDA with CCK, if that is neg  consider CT but he did have one in December.  Pt also had a stress test on 12/30 by Dr. Ubaldo Glassing and it did not show any obvious cardiac except hypoperfusion in the inferior c/w either scar or soft tissue attenuation.  He had no symptoms with the test.  Electronic Signatures: Manya Silvas (MD)  (Signed on 15-Jan-14 17:48)  Authored  Last Updated: 15-Jan-14 17:48 by Manya Silvas (MD)

## 2014-09-04 NOTE — Consult Note (Signed)
Brief Consult Note: Diagnosis: abdominal pain.   Patient was seen by consultant.   Recommend to proceed with surgery or procedure.   Orders entered.   Comments: Pt known to me from his UGI bleed. I do believe his gallstones are likely the cause of his pain. We discussed the option of waiting for medical management of his diabetic gastroparesis but he chose lap ccy. He understands the risks and likely postop course (which may be limited by his gastroparesis) and he wishes to proceed with surgery tomorrow. I have ordered an amylase and lipase.  Electronic Signatures: Consuela Mimes (MD)  (Signed 17-Jan-14 15:16)  Authored: Brief Consult Note   Last Updated: 17-Jan-14 15:16 by Consuela Mimes (MD)

## 2015-06-25 DIAGNOSIS — E1159 Type 2 diabetes mellitus with other circulatory complications: Secondary | ICD-10-CM

## 2015-06-25 DIAGNOSIS — I2510 Atherosclerotic heart disease of native coronary artery without angina pectoris: Secondary | ICD-10-CM | POA: Insufficient documentation

## 2015-06-25 DIAGNOSIS — G4733 Obstructive sleep apnea (adult) (pediatric): Secondary | ICD-10-CM | POA: Insufficient documentation

## 2015-06-25 DIAGNOSIS — K219 Gastro-esophageal reflux disease without esophagitis: Secondary | ICD-10-CM | POA: Insufficient documentation

## 2015-07-27 ENCOUNTER — Encounter: Payer: Self-pay | Admitting: *Deleted

## 2015-07-28 ENCOUNTER — Encounter
Admission: RE | Disposition: A | Discharge status: Home / Self Care | Payer: Self-pay | Source: Ambulatory Visit | Attending: Unknown Physician Specialty

## 2015-07-28 ENCOUNTER — Ambulatory Visit: Payer: Medicare Other | Admitting: Anesthesiology

## 2015-07-28 ENCOUNTER — Encounter: Payer: Self-pay | Admitting: *Deleted

## 2015-07-28 ENCOUNTER — Ambulatory Visit
Admission: RE | Admit: 2015-07-28 | Discharge: 2015-07-28 | Disposition: A | Discharge status: Home / Self Care | Payer: Medicare Other | Source: Ambulatory Visit | Attending: Unknown Physician Specialty | Admitting: Unknown Physician Specialty

## 2015-07-28 DIAGNOSIS — Z7982 Long term (current) use of aspirin: Secondary | ICD-10-CM | POA: Insufficient documentation

## 2015-07-28 DIAGNOSIS — F17200 Nicotine dependence, unspecified, uncomplicated: Secondary | ICD-10-CM | POA: Diagnosis not present

## 2015-07-28 DIAGNOSIS — Z9861 Coronary angioplasty status: Secondary | ICD-10-CM | POA: Insufficient documentation

## 2015-07-28 DIAGNOSIS — Z791 Long term (current) use of non-steroidal anti-inflammatories (NSAID): Secondary | ICD-10-CM | POA: Insufficient documentation

## 2015-07-28 DIAGNOSIS — Z1211 Encounter for screening for malignant neoplasm of colon: Secondary | ICD-10-CM | POA: Insufficient documentation

## 2015-07-28 DIAGNOSIS — Z7984 Long term (current) use of oral hypoglycemic drugs: Secondary | ICD-10-CM | POA: Diagnosis not present

## 2015-07-28 DIAGNOSIS — K641 Second degree hemorrhoids: Secondary | ICD-10-CM | POA: Diagnosis not present

## 2015-07-28 DIAGNOSIS — K635 Polyp of colon: Secondary | ICD-10-CM | POA: Diagnosis not present

## 2015-07-28 DIAGNOSIS — Z91048 Other nonmedicinal substance allergy status: Secondary | ICD-10-CM | POA: Insufficient documentation

## 2015-07-28 DIAGNOSIS — I739 Peripheral vascular disease, unspecified: Secondary | ICD-10-CM | POA: Diagnosis not present

## 2015-07-28 DIAGNOSIS — Z8711 Personal history of peptic ulcer disease: Secondary | ICD-10-CM | POA: Insufficient documentation

## 2015-07-28 DIAGNOSIS — K219 Gastro-esophageal reflux disease without esophagitis: Secondary | ICD-10-CM | POA: Diagnosis not present

## 2015-07-28 DIAGNOSIS — Z86010 Personal history of colonic polyps: Secondary | ICD-10-CM | POA: Insufficient documentation

## 2015-07-28 DIAGNOSIS — Z79899 Other long term (current) drug therapy: Secondary | ICD-10-CM | POA: Diagnosis not present

## 2015-07-28 DIAGNOSIS — N400 Enlarged prostate without lower urinary tract symptoms: Secondary | ICD-10-CM | POA: Diagnosis not present

## 2015-07-28 DIAGNOSIS — G4730 Sleep apnea, unspecified: Secondary | ICD-10-CM | POA: Insufficient documentation

## 2015-07-28 DIAGNOSIS — E7800 Pure hypercholesterolemia, unspecified: Secondary | ICD-10-CM | POA: Insufficient documentation

## 2015-07-28 DIAGNOSIS — Z9049 Acquired absence of other specified parts of digestive tract: Secondary | ICD-10-CM | POA: Diagnosis not present

## 2015-07-28 DIAGNOSIS — K621 Rectal polyp: Secondary | ICD-10-CM | POA: Diagnosis not present

## 2015-07-28 DIAGNOSIS — Z9889 Other specified postprocedural states: Secondary | ICD-10-CM | POA: Insufficient documentation

## 2015-07-28 DIAGNOSIS — J449 Chronic obstructive pulmonary disease, unspecified: Secondary | ICD-10-CM | POA: Diagnosis not present

## 2015-07-28 DIAGNOSIS — E119 Type 2 diabetes mellitus without complications: Secondary | ICD-10-CM | POA: Diagnosis not present

## 2015-07-28 DIAGNOSIS — I2510 Atherosclerotic heart disease of native coronary artery without angina pectoris: Secondary | ICD-10-CM | POA: Diagnosis not present

## 2015-07-28 HISTORY — DX: Type 2 diabetes mellitus without complications: E11.9

## 2015-07-28 HISTORY — DX: Headache: R51

## 2015-07-28 HISTORY — DX: Headache, unspecified: R51.9

## 2015-07-28 HISTORY — PX: COLONOSCOPY WITH PROPOFOL: SHX5780

## 2015-07-28 HISTORY — DX: Sleep apnea, unspecified: G47.30

## 2015-07-28 HISTORY — DX: Chronic or unspecified gastric ulcer with hemorrhage: K25.4

## 2015-07-28 HISTORY — DX: Atherosclerotic heart disease of native coronary artery without angina pectoris: I25.10

## 2015-07-28 HISTORY — DX: Chronic obstructive pulmonary disease, unspecified: J44.9

## 2015-07-28 HISTORY — DX: Peripheral vascular disease, unspecified: I73.9

## 2015-07-28 HISTORY — DX: Pure hypercholesterolemia, unspecified: E78.00

## 2015-07-28 HISTORY — DX: Benign prostatic hyperplasia without lower urinary tract symptoms: N40.0

## 2015-07-28 HISTORY — DX: Gastro-esophageal reflux disease without esophagitis: K21.9

## 2015-07-28 LAB — GLUCOSE, CAPILLARY: GLUCOSE-CAPILLARY: 122 mg/dL — AB (ref 65–99)

## 2015-07-28 SURGERY — COLONOSCOPY WITH PROPOFOL
Anesthesia: General

## 2015-07-28 MED ORDER — FENTANYL CITRATE (PF) 100 MCG/2ML IJ SOLN: 25.0000 ug | INTRAMUSCULAR | Status: DC | PRN

## 2015-07-28 MED ORDER — SODIUM CHLORIDE 0.9 % IV SOLN
INTRAVENOUS | Status: DC
  Administered 2015-07-28: 1000 mL via INTRAVENOUS

## 2015-07-28 MED ORDER — ONDANSETRON HCL 4 MG/2ML IJ SOLN: 4.0000 mg | Freq: Once | INTRAMUSCULAR | Status: DC | PRN

## 2015-07-28 MED ORDER — PROPOFOL 500 MG/50ML IV EMUL
INTRAVENOUS | Status: DC | PRN
  Administered 2015-07-28: 140 ug/kg/min via INTRAVENOUS

## 2015-07-28 MED ORDER — PROPOFOL 10 MG/ML IV BOLUS
INTRAVENOUS | Status: DC | PRN
  Administered 2015-07-28: 100 mg via INTRAVENOUS

## 2015-07-28 MED ORDER — SODIUM CHLORIDE 0.9 % IV SOLN: INTRAVENOUS | Status: DC

## 2015-07-28 MED ORDER — MIDAZOLAM HCL 2 MG/2ML IJ SOLN
INTRAMUSCULAR | Status: DC | PRN
  Administered 2015-07-28: 1 mg via INTRAVENOUS

## 2015-07-28 NOTE — Anesthesia Preprocedure Evaluation (Signed)
Anesthesia Evaluation  Patient identified by MRN, date of birth, ID band Patient awake    Reviewed: Allergy & Precautions, NPO status , Patient's Chart, lab work & pertinent test results  Airway Mallampati: III  TM Distance: >3 FB Neck ROM: Full    Dental  (+) Chipped   Pulmonary sleep apnea , COPD, Current Smoker,    Pulmonary exam normal breath sounds clear to auscultation       Cardiovascular hypertension, Pt. on medications + CAD and + Peripheral Vascular Disease  Normal cardiovascular exam     Neuro/Psych  Headaches,    GI/Hepatic Neg liver ROS, PUD, GERD  Medicated and Controlled,  Endo/Other  diabetes, Well Controlled, Type 2, Oral Hypoglycemic Agents  Renal/GU negative Renal ROS     Musculoskeletal negative musculoskeletal ROS (+)   Abdominal Normal abdominal exam  (+)   Peds negative pediatric ROS (+)  Hematology negative hematology ROS (+)   Anesthesia Other Findings   Reproductive/Obstetrics                             Anesthesia Physical Anesthesia Plan  ASA: III  Anesthesia Plan: General   Post-op Pain Management:    Induction: Intravenous  Airway Management Planned: Nasal Cannula  Additional Equipment:   Intra-op Plan:   Post-operative Plan:   Informed Consent: I have reviewed the patients History and Physical, chart, labs and discussed the procedure including the risks, benefits and alternatives for the proposed anesthesia with the patient or authorized representative who has indicated his/her understanding and acceptance.   Dental advisory given  Plan Discussed with: CRNA and Surgeon  Anesthesia Plan Comments:         Anesthesia Quick Evaluation

## 2015-07-28 NOTE — Anesthesia Postprocedure Evaluation (Signed)
Anesthesia Post Note  Patient: [name]  Procedure(s) Performed: Procedure(s) (LRB): COLONOSCOPY WITH PROPOFOL (N/A)  Patient location during evaluation: PACU Anesthesia Type: General Level of consciousness: oriented and awake and alert Pain management: pain level controlled Vital Signs Assessment: post-procedure vital signs reviewed and stable Respiratory status: spontaneous breathing Cardiovascular status: blood pressure returned to baseline Anesthetic complications: no    Last Vitals:  Filed Vitals:   07/28/15 0940 07/28/15 0950  BP: 103/80 114/82  Pulse: 70 68  Temp:    Resp: 19 21    Last Pain: There were no vitals filed for this visit.               Kenzley Ke

## 2015-07-28 NOTE — H&P (Signed)
   Primary Care Physician:  Raelene Bott, MD Primary Gastroenterologist:  Dr. Vira Agar  Pre-Procedure History & Physical: HPI:  [name] is a 70 y.o. male is here for an colonoscopy.   Past Medical History  Diagnosis Date  . Coronary artery disease   . Headache   . Diabetes mellitus without complication ([place])   . GERD (gastroesophageal reflux disease)   . Peripheral vascular disease ([place])   . Gastric ulcer with hemorrhage   . Sleep apnea   . COPD (chronic obstructive pulmonary disease) ([place])   . Hypercholesterolemia   . Prostate enlargement     Past Surgical History  Procedure Laterality Date  . Hernia repair    . Coronary angioplasty  x2  . Cholecystectomy      Prior to Admission medications   Medication Sig Start Date End Date Taking? Authorizing Provider  aspirin EC 81 MG tablet Take 81 mg by mouth daily.   Yes Historical Provider, MD  ibuprofen (ADVIL,MOTRIN) 200 MG tablet Take 400 mg by mouth daily.   Yes Historical Provider, MD  LACTOBACILLUS RHAMNOSUS, GG, PO Take 1 capsule by mouth.   Yes Historical Provider, MD  metFORMIN (GLUCOPHAGE) 500 MG tablet Take 500 mg by mouth 2 (two) times daily with a meal.   Yes Historical Provider, MD  Multiple Vitamin (MULTIVITAMIN) tablet Take 1 tablet by mouth daily.   Yes Historical Provider, MD  pantoprazole (PROTONIX) 40 MG tablet Take 40 mg by mouth daily.   Yes Historical Provider, MD  simvastatin (ZOCOR) 40 MG tablet Take 40 mg by mouth daily.   Yes Historical Provider, MD  tamsulosin (FLOMAX) 0.4 MG CAPS capsule Take 0.4 mg by mouth.   Yes Historical Provider, MD  lisinopril (PRINIVIL,ZESTRIL) 10 MG tablet Take 10 mg by mouth daily.      Historical Provider, MD    Allergies as of 07/15/2015 - reviewed 12/02/2007  Allergen Reaction Noted  . Iodine  12/02/2007    History reviewed. No pertinent family history.  Social History   Social History  . Marital Status: Widowed    Spouse Name: N/A  . Number of Children:  N/A  . Years of Education: N/A   Occupational History  . Not on file.   Social History Main Topics  . Smoking status: Current Every Day Smoker  . Smokeless tobacco: Never Used  . Alcohol Use: Yes  . Drug Use: No  . Sexual Activity: Not on file   Other Topics Concern  . Not on file   Social History Narrative    Review of Systems: See HPI, otherwise negative ROS  Physical Exam: BP 126/68 mmHg  Pulse 81  Temp(Src) 97 F (36.1 C) (Tympanic)  Resp 20  Ht 6' (1.829 m)  Wt 108.863 kg (240 lb)  BMI 32.54 kg/m2  SpO2 95% General:   Alert,  pleasant and cooperative in NAD Head:  Normocephalic and atraumatic. Neck:  Supple; no masses or thyromegaly. Lungs:  Clear throughout to auscultation.    Heart:  Regular rate and rhythm. Abdomen:  Soft, nontender and nondistended. Normal bowel sounds, without guarding, and without rebound.   Neurologic:  Alert and  oriented x4;  grossly normal neurologically.  Impression/Plan: [name] is here for an colonoscopy to be performed for [name] colon polyps  Risks, benefits, limitations, and alternatives regarding  colonoscopy have been reviewed with the patient.  Questions have been answered.  All parties agreeable.   Gaylyn Cheers, MD  07/28/2015, 8:39 AM

## 2015-07-28 NOTE — Op Note (Signed)
[facility] Patient Name: [name] Procedure Date: 07/28/2015 8:41 AM MRN: [id number] Account #: [id number] Date of Birth: 1946/04/17 Admit Type: Outpatient Age: 70 Room: [facility] ENDO ROOM 4 Gender: Male Note Status: Finalized Procedure:            Colonoscopy Indications:          High risk colon cancer surveillance: Personal history                        of colonic polyps Providers:            Manya Silvas, MD Referring MD:         Pamala Hurry. Heber [place], MD (Referring MD) Medicines:            Propofol per Anesthesia Complications:        No immediate complications. Procedure:            Pre-Anesthesia Assessment:                       - After reviewing the risks and benefits, the patient                        was deemed in satisfactory condition to undergo the                        procedure.                       After obtaining informed consent, the colonoscope was                        passed under direct vision. Throughout the procedure,                        the patient's blood pressure, pulse, and oxygen                        saturations were monitored continuously. The                        Colonoscope was introduced through the anus and                        advanced to the the cecum, identified by appendiceal                        orifice and ileocecal valve. The colonoscopy was                        performed without difficulty. The patient tolerated the                        procedure well. The quality of the bowel preparation                        was excellent. Findings:      A small polyp was found in the descending colon. The polyp was sessile.       The polyp was removed with a hot snare. Resection and retrieval were       complete.      Four sessile polyps were found  in the rectum, sigmoid colon and       descending colon. The polyps were diminutive in size. These polyps were       removed with a jumbo cold  forceps. Resection and retrieval were complete.      Internal hemorrhoids were found during endoscopy. The hemorrhoids were       small and Grade I (internal hemorrhoids that do not prolapse). Impression:           - One small polyp in the descending colon, removed with                        a hot snare. Resected and retrieved.                       - Four diminutive polyps in the rectum, in the sigmoid                        colon and in the descending colon, removed with a jumbo                        cold forceps. Resected and retrieved.                       - Internal hemorrhoids. Recommendation:       - Await pathology results. Manya Silvas, MD 07/28/2015 9:16:56 AM This report has been signed electronically. Number of Addenda: 0 Note Initiated On: 07/28/2015 8:41 AM Scope Withdrawal Time: 0 hours 18 minutes 22 seconds  Total Procedure Duration: 0 hours 25 minutes 16 seconds       [facility]

## 2015-07-28 NOTE — Transfer of Care (Signed)
Immediate Anesthesia Transfer of Care Note  Patient: [name]  Procedure(s) Performed: Procedure(s): COLONOSCOPY WITH PROPOFOL (N/A)  Patient Location: PACU and Endoscopy Unit  Anesthesia Type:General  Level of Consciousness: awake, alert  and oriented  Airway & Oxygen Therapy: Patient Spontanous Breathing and Patient connected to nasal cannula oxygen  Post-op Assessment: Report given to RN and Post -op Vital signs reviewed and stable  Post vital signs: Reviewed and stable  Last Vitals:  Filed Vitals:   07/28/15 0801 07/28/15 0920  BP: 126/68 108/69  Pulse: 81   Temp: 36.1 C 36.1 C  Resp: 20 17    Complications: No apparent anesthesia complications

## 2015-07-29 LAB — SURGICAL PATHOLOGY

## 2015-09-01 DIAGNOSIS — K635 Polyp of colon: Secondary | ICD-10-CM | POA: Insufficient documentation

## 2015-09-01 LAB — HM HEPATITIS C SCREENING LAB: HM Hepatitis Screen: NEGATIVE

## 2016-10-29 NOTE — Progress Notes (Addendum)
10/30/2016 12:06 PM   [name] December 05, 1945 [id number]  Referring provider: Raelene Bott, MD [address] [place] [address]  Chief Complaint  Patient presents with  . New Patient (Initial Visit)    BPH referred by Dr. Heber Clemmons    HPI: Patient is a 71 year old Caucasian male who is referred by his primary care physician, Dr. Raelene Bott, for BPH with L UTS.  BPH WITH LUTS His IPSS score today is 22, which is severe lower urinary tract symptomatology. He is unhappy with his quality life due to his urinary symptoms. His PVR is 64 mL.    His major complaints today are frequency (he states that he lays around a lot as he is retired and every time he rolls over), urgency, dysuria (not a burning or stinging) , nocturia x 1-3, incontinence (post void dribbling), intermittency and a weak urinary stream.   He has had these symptoms for years.  He denies any dysuria, hematuria or suprapubic pain.   He currently taking tamsulosin 0.4 mg daily x 3 years.    He also denies any recent fevers, chills, nausea or vomiting.  He does not have a family history of PCa.  He states he drinks one quart of Propel water.     He had epididymitis during WWII.        IPSS    Row Name 10/30/16 1100         International Prostate Symptom Score   How often have you had the sensation of not emptying your bladder? More than half the time     How often have you had to urinate less than every two hours? More than half the time     How often have you found you stopped and started again several times when you urinated? Less than half the time     How often have you found it difficult to postpone urination? Almost always     How often have you had a weak urinary stream? Almost always     How often have you had to strain to start urination? Not at All     How many times did you typically get up at night to urinate? 2 Times     Total IPSS Score 22       Quality of Life due to  urinary symptoms   If you were to spend the rest of your life with your urinary condition just the way it is now how would you feel about that? Unhappy        Score:  1-7 Mild 8-19 Moderate 20-35 Severe   PMH: Past Medical History:  Diagnosis Date  . COPD (chronic obstructive pulmonary disease) ([place])   . Coronary artery disease   . Diabetes mellitus without complication ([place])   . Gastric ulcer with hemorrhage   . GERD (gastroesophageal reflux disease)   . Headache   . Hypercholesterolemia   . Peripheral vascular disease ([place])   . Prostate enlargement   . Skin cancer   . Sleep apnea     Surgical History: Past Surgical History:  Procedure Laterality Date  . CHOLECYSTECTOMY    . circumscision     from army surgery for epididymitis  . COLONOSCOPY WITH PROPOFOL N/A 07/28/2015   Procedure: COLONOSCOPY WITH PROPOFOL;  Surgeon: Manya Silvas, MD;  Location: [facility] ENDOSCOPY;  Service: Endoscopy;  Laterality: N/A;  . CORONARY ANGIOPLASTY  x2  . HERNIA REPAIR    .  stomach ulcer surgery      Home Medications:  Allergies as of 10/30/2016      Reactions   Iodine       Medication List       Accurate as of 10/30/16 11:59 PM. Always use your most recent med list.          ACETAMINOPHEN-CAFFEINE PO Take by mouth.   aspirin EC 81 MG tablet Take 81 mg by mouth daily.   aspirin-acetaminophen-caffeine 034-742-59 MG tablet Commonly known as:  EXCEDRIN MIGRAINE Take by mouth.   azelastine 0.1 % nasal spray Commonly known as:  ASTELIN 2 sprays by Each Nare route Two (2) times a day.   ibuprofen 200 MG tablet Commonly known as:  ADVIL,MOTRIN Take 400 mg by mouth daily.   LACTOBACILLUS RHAMNOSUS (GG) PO Take 1 capsule by mouth.   lisinopril 10 MG tablet Commonly known as:  PRINIVIL,ZESTRIL Take 10 mg by mouth daily.   metFORMIN 500 MG tablet Commonly known as:  GLUCOPHAGE Take 500 mg by mouth 2 (two) times daily with a meal.   multivitamin tablet Take 1 tablet  by mouth daily.   MULTI-VITAMINS Tabs Take by mouth.   pantoprazole 40 MG tablet Commonly known as:  PROTONIX Take 40 mg by mouth daily.   simvastatin 40 MG tablet Commonly known as:  ZOCOR Take 40 mg by mouth daily.   tamsulosin 0.4 MG Caps capsule Commonly known as:  FLOMAX Take 0.4 mg by mouth.   triamcinolone 0.025 % cream Commonly known as:  KENALOG Place into the nose.       Allergies:  Allergies  Allergen Reactions  . Iodine     Family History: Family History  Problem Relation Age of Onset  . Kidney cancer Neg Hx   . Bladder Cancer Neg Hx   . Prostate cancer Neg Hx     Social History:  reports that he has been smoking.  He has never used smokeless tobacco. He reports that he drinks alcohol. He reports that he does not use drugs.  ROS: UROLOGY Frequent Urination?: Yes Hard to postpone urination?: Yes Burning/pain with urination?: Yes Get up at night to urinate?: Yes Leakage of urine?: Yes Urine stream starts and stops?: Yes Trouble starting stream?: No Do you have to strain to urinate?: No Blood in urine?: No Urinary tract infection?: No Sexually transmitted disease?: No Injury to kidneys or bladder?: No Painful intercourse?: No Weak stream?: Yes Erection problems?: Yes Penile pain?: Yes  Gastrointestinal Nausea?: No Vomiting?: No Indigestion/heartburn?: Yes Diarrhea?: Yes Constipation?: No  Constitutional Fever: Yes Night sweats?: Yes Weight loss?: No Fatigue?: No  Skin Skin rash/lesions?: No Itching?: No  Eyes Blurred vision?: No Double vision?: No  Ears/Nose/Throat Sore throat?: No Sinus problems?: No  Hematologic/Lymphatic Swollen glands?: No Easy bruising?: No  Cardiovascular Leg swelling?: No Chest pain?: No  Respiratory Cough?: Yes Shortness of breath?: No  Endocrine Excessive thirst?: No  Musculoskeletal Back pain?: Yes Joint pain?: No  Neurological Headaches?: Yes Dizziness?:  No  Psychologic Depression?: No Anxiety?: No  Physical Exam: BP 123/79   Pulse 92   Ht 6' (1.829 m)   Wt 250 lb 11.2 oz (113.7 kg)   BMI 34.00 kg/m   Constitutional: Well nourished. Alert and oriented, No acute distress. HEENT:  AT, moist mucus membranes. Trachea midline, no masses. Cardiovascular: No clubbing, cyanosis, or edema. Respiratory: Normal respiratory effort, no increased work of breathing. GI: Abdomen is soft, non tender, non distended, no abdominal masses. Liver and  spleen not palpable.  No hernias appreciated.  Stool sample for occult testing is not indicated.   GU: No CVA tenderness.  No bladder fullness or masses.  Patient with circumcised phallus.  Urethral meatus is patent.  No penile discharge. No penile lesions or rashes. Scrotum without lesions, cysts, rashes and/or edema.  Testicles are located scrotally bilaterally. No masses are appreciated in the testicles. Left and right epididymis are normal. Rectal: Patient with  normal sphincter tone. Anus and perineum without scarring or rashes. No rectal masses are appreciated. Prostate is approximately 45 grams, no nodules are appreciated. Seminal vesicles are normal. Skin: No rashes, bruises or suspicious lesions. Lymph: No cervical or inguinal adenopathy. Neurologic: Grossly intact, no focal deficits, moving all 4 extremities. Psychiatric: Normal mood and affect.  Laboratory Data: Lab Results  Component Value Date   WBC 4.3 06/02/2012   HGB 11.3 (L) 06/02/2012   HCT 34.4 (L) 06/02/2012   MCV 86 06/02/2012   PLT 172 06/02/2012    Lab Results  Component Value Date   CREATININE 0.94 05/29/2012     Lab Results  Component Value Date   HGBA1C 7.0 (H) 04/20/2012        Component Value Date/Time   CHOL 107 04/21/2012 0351   HDL 18 (L) 04/21/2012 0351   VLDL 42 (H) 04/21/2012 0351   LDLCALC 47 04/21/2012 0351    Lab Results  Component Value Date   AST 49 (H) 05/29/2012   Lab Results  Component  Value Date   ALT 68 05/29/2012     Pertinent Imaging: Results for BASHEER, MOLCHAN (MRN [id number]) as of 11/28/2016 12:06  Ref. Range 10/30/2016 11:51  Scan Result Unknown 64     Assessment & Plan:    1. BPH with LUTS  - IPSS score is 22/5  - Continue conservative management, avoiding bladder irritants and timed voiding's  - most bothersome symptoms is/are frequency and urgency - given a trial of Myrbetriq 25 mg daily  - Encouraged the patient to engage in moderate exercise 4 days weekly as a sedentary lifestyle has been shown to worsen L UTS  - RTC in 3 months for I PSS and PVR   2. Frequency  - Patient does have sleep apnea, but he is not interested in obtaining a CPAP machine, explained that untreated sleep apnea may result in worsening of LU TS  3. Urgency  - see above   Return in about 3 weeks (around 11/20/2016) for IPSS and PVR.  These notes generated with voice recognition software. I apologize for typographical errors.  Zara Council, [facility] [address] [place], [place] [address] [phone]

## 2016-10-30 ENCOUNTER — Encounter: Payer: Self-pay | Admitting: Urology

## 2016-10-30 ENCOUNTER — Ambulatory Visit (INDEPENDENT_AMBULATORY_CARE_PROVIDER_SITE_OTHER): Payer: Medicare HMO | Admitting: Urology

## 2016-10-30 VITALS — BP 123/79 | HR 92 | Ht 72.0 in | Wt 250.7 lb

## 2016-10-30 DIAGNOSIS — N400 Enlarged prostate without lower urinary tract symptoms: Secondary | ICD-10-CM | POA: Diagnosis not present

## 2016-10-30 DIAGNOSIS — R350 Frequency of micturition: Secondary | ICD-10-CM | POA: Diagnosis not present

## 2016-10-30 DIAGNOSIS — R3915 Urgency of urination: Secondary | ICD-10-CM | POA: Diagnosis not present

## 2016-10-30 DIAGNOSIS — N401 Enlarged prostate with lower urinary tract symptoms: Secondary | ICD-10-CM | POA: Diagnosis not present

## 2016-10-30 DIAGNOSIS — N138 Other obstructive and reflux uropathy: Secondary | ICD-10-CM | POA: Diagnosis not present

## 2016-10-30 LAB — BLADDER SCAN AMB NON-IMAGING: Scan Result: 64

## 2016-10-31 ENCOUNTER — Telehealth: Payer: Self-pay

## 2016-10-31 LAB — PSA: PROSTATE SPECIFIC AG, SERUM: 2.3 ng/mL (ref 0.0–4.0)

## 2016-10-31 NOTE — Telephone Encounter (Signed)
Patient notified/SW 

## 2016-10-31 NOTE — Telephone Encounter (Signed)
-----   Message from Nori Riis, PA-C sent at 10/31/2016  8:42 AM EDT ----- Please let Mr. Monks know that his PSA is normal.

## 2016-11-27 NOTE — Progress Notes (Signed)
11/28/2016 12:08 PM   [name] 1946/04/23 [id number]  Referring provider: Raelene Bott, MD [address] [place] [address]  Chief Complaint  Patient presents with  . Benign Prostatic Hypertrophy    3wk    HPI: Patient is a 71 year old Caucasian male who presents today for a 3 month follow up.  Patient was referred by his primary care physician, Dr. Raelene Bott, for BPH with L UTS.  BPH WITH LUTS Patient refused to fill out the I PSS score sheet today.  He does state that the Myrbetriq 25 mg daily has helped 50 %.  His PVR is 61 mL.  His previous I PSS score was 22/5.  His previous PVR was 64 mL.    His major complaints today are frequency (he states that he lays around a lot as he is retired and every time he rolls over), urgency, dysuria (not a burning or stinging) , nocturia x 1-3, incontinence (post void dribbling), intermittency and a weak urinary stream.   He has had these symptoms for years.  He denies any dysuria, hematuria or suprapubic pain.   He currently taking tamsulosin 0.4 mg daily x 3 years.    PSA was 2.3 in 10/2016.    He also denies any recent fevers, chills, nausea or vomiting.  He does not have a family history of PCa.  He states he drinks one quart of Propel water.     He had epididymitis during WWII.        IPSS    Row Name 10/30/16 1100         International Prostate Symptom Score   How often have you had the sensation of not emptying your bladder? More than half the time     How often have you had to urinate less than every two hours? More than half the time     How often have you found you stopped and started again several times when you urinated? Less than half the time     How often have you found it difficult to postpone urination? Almost always     How often have you had a weak urinary stream? Almost always     How often have you had to strain to start urination? Not at All     How many times did you  typically get up at night to urinate? 2 Times     Total IPSS Score 22       Quality of Life due to urinary symptoms   If you were to spend the rest of your life with your urinary condition just the way it is now how would you feel about that? Unhappy        Score:  1-7 Mild 8-19 Moderate 20-35 Severe   PMH: Past Medical History:  Diagnosis Date  . COPD (chronic obstructive pulmonary disease) ([place])   . Coronary artery disease   . Diabetes mellitus without complication ([place])   . Gastric ulcer with hemorrhage   . GERD (gastroesophageal reflux disease)   . Headache   . Hypercholesterolemia   . Peripheral vascular disease ([place])   . Prostate enlargement   . Skin cancer   . Sleep apnea     Surgical History: Past Surgical History:  Procedure Laterality Date  . CHOLECYSTECTOMY    . circumscision     from army surgery for epididymitis  . COLONOSCOPY WITH PROPOFOL N/A 07/28/2015   Procedure: COLONOSCOPY WITH PROPOFOL;  Surgeon: Manya Silvas, MD;  Location[facility] ENDOSCOPY;  Service: Endoscopy;  Laterality: N/A;  . CORONARY ANGIOPLASTY  x2  . HERNIA REPAIR    . stomach ulcer surgery      Home Medications:  Allergies as of 11/28/2016      Reactions   Iodine       Medication List       Accurate as of 11/28/16 12:08 PM. Always use your most recent med list.          ACETAMINOPHEN-CAFFEINE PO Take by mouth.   aspirin EC 81 MG tablet Take 81 mg by mouth daily.   aspirin-acetaminophen-caffeine 409-811-91 MG tablet Commonly known as:  EXCEDRIN MIGRAINE Take by mouth.   azelastine 0.1 % nasal spray Commonly known as:  ASTELIN 2 sprays by Each Nare route Two (2) times a day.   ibuprofen 200 MG tablet Commonly known as:  ADVIL,MOTRIN Take 400 mg by mouth daily.   LACTOBACILLUS RHAMNOSUS (GG) PO Take 1 capsule by mouth.   lisinopril 10 MG tablet Commonly known as:  PRINIVIL,ZESTRIL Take 10 mg by mouth daily.   metFORMIN 500 MG tablet Commonly known as:   GLUCOPHAGE Take 500 mg by mouth 2 (two) times daily with a meal.   multivitamin tablet Take 1 tablet by mouth daily.   MULTI-VITAMINS Tabs Take by mouth.   pantoprazole 40 MG tablet Commonly known as:  PROTONIX Take 40 mg by mouth daily.   simvastatin 40 MG tablet Commonly known as:  ZOCOR Take 40 mg by mouth daily.   tamsulosin 0.4 MG Caps capsule Commonly known as:  FLOMAX Take 0.4 mg by mouth.   triamcinolone 0.025 % cream Commonly known as:  KENALOG Place into the nose.       Allergies:  Allergies  Allergen Reactions  . Iodine     Family History: Family History  Problem Relation Age of Onset  . Kidney cancer Neg Hx   . Bladder Cancer Neg Hx   . Prostate cancer Neg Hx     Social History:  reports that he has been smoking.  He has never used smokeless tobacco. He reports that he drinks alcohol. He reports that he does not use drugs.  ROS: UROLOGY Frequent Urination?: Yes Hard to postpone urination?: Yes Burning/pain with urination?: Yes Get up at night to urinate?: Yes Leakage of urine?: Yes Urine stream starts and stops?: Yes Trouble starting stream?: Yes Do you have to strain to urinate?: No Blood in urine?: No Urinary tract infection?: No Sexually transmitted disease?: No Injury to kidneys or bladder?: No Painful intercourse?: No Weak stream?: Yes Erection problems?: No Penile pain?: No  Gastrointestinal Nausea?: No Vomiting?: No Indigestion/heartburn?: No Diarrhea?: No Constipation?: No  Constitutional Fever: No Night sweats?: No Weight loss?: No Fatigue?: No  Skin Skin rash/lesions?: No Itching?: No  Eyes Blurred vision?: No Double vision?: No  Ears/Nose/Throat Sore throat?: No Sinus problems?: Yes  Hematologic/Lymphatic Swollen glands?: No Easy bruising?: No  Cardiovascular Leg swelling?: No Chest pain?: No  Respiratory Cough?: Yes Shortness of breath?: No  Endocrine Excessive thirst?:  No  Musculoskeletal Back pain?: No Joint pain?: No  Neurological Headaches?: No Dizziness?: No  Psychologic Depression?: No Anxiety?: No  Physical Exam: BP (!) 177/105   Pulse 90   Ht 6' (1.829 m)   Wt 260 lb (117.9 kg)   BMI 35.26 kg/m   Constitutional: Well nourished. Alert and oriented, No acute distress. HEENT: [place] AT, moist mucus membranes. Trachea midline, no masses.  Cardiovascular: No clubbing, cyanosis, or edema. Respiratory: Normal respiratory effort, no increased work of breathing. Skin: No rashes, bruises or suspicious lesions. Lymph: No cervical or inguinal adenopathy. Neurologic: Grossly intact, no focal deficits, moving all 4 extremities. Psychiatric: Normal mood and affect.  Laboratory Data: Results for orders placed or performed in visit on 10/30/16  PSA  Result Value Ref Range   Prostate Specific Ag, Serum 2.3 0.0 - 4.0 ng/mL  BLADDER SCAN AMB NON-IMAGING  Result Value Ref Range   Scan Result 64    I have reviewed the labs  Pertinent Imaging: Results for TREYVIN, GLIDDEN (MRN [id number]) as of 11/28/2016 12:41  Ref. Range 11/28/2016 12:11  Scan Result Unknown 61    Assessment & Plan:    1. BPH with LUTS  - IPSS score is 22/5  - Continue conservative management, avoiding bladder irritants and timed voiding's  - most bothersome symptoms is/are frequency and urgency  - Encouraged the patient to engage in moderate exercise 4 days weekly as a sedentary lifestyle has been shown to worsen L UTS  - RTC in 3 months for IPS'S and PVR   2. Frequency  - Patient does have sleep apnea, but he is not interested in obtaining a CPAP machine, explained that untreated sleep apnea may result in worsening of LU TS  3. Urgency  - see above   Return in about 1 year (around 11/28/2017) for IPSS, PVR, PSA and exam.  These notes generated with voice recognition software. I apologize for typographical errors.  Zara Council, [facility] [address] [place], [place] [address] [phone]

## 2016-11-28 ENCOUNTER — Ambulatory Visit: Payer: Medicare HMO | Admitting: Urology

## 2016-11-28 ENCOUNTER — Encounter: Payer: Self-pay | Admitting: Urology

## 2016-11-28 VITALS — BP 177/105 | HR 90 | Ht 72.0 in | Wt 260.0 lb

## 2016-11-28 DIAGNOSIS — N138 Other obstructive and reflux uropathy: Secondary | ICD-10-CM

## 2016-11-28 DIAGNOSIS — R3915 Urgency of urination: Secondary | ICD-10-CM

## 2016-11-28 DIAGNOSIS — R350 Frequency of micturition: Secondary | ICD-10-CM

## 2016-11-28 DIAGNOSIS — N401 Enlarged prostate with lower urinary tract symptoms: Secondary | ICD-10-CM

## 2016-11-28 LAB — BLADDER SCAN AMB NON-IMAGING: Scan Result: 61

## 2016-11-28 MED ORDER — MIRABEGRON ER 25 MG PO TB24: 25.0000 mg | ORAL_TABLET | Freq: Every day | ORAL | 3 refills | Status: DC

## 2016-11-29 ENCOUNTER — Other Ambulatory Visit: Payer: Self-pay | Admitting: Family Medicine

## 2016-11-29 MED ORDER — MIRABEGRON ER 25 MG PO TB24: 25.0000 mg | ORAL_TABLET | Freq: Every day | ORAL | 0 refills | Status: DC

## 2016-12-01 ENCOUNTER — Telehealth: Payer: Self-pay | Admitting: Urology

## 2016-12-01 NOTE — Telephone Encounter (Signed)
Pt walked into office having Rx issues with [employer] and the cost of his Rx of [place].  Pt states he can't afford the brand name and that [employer] told him that there are other medications that can treat his issue.  Pt asks that someone please call him as soon as can to address his issue because the meds are working for him.  He also states that was told to come into the office a few weeks ago to get samples and when he got here we didn't have the 25mg  he needed and that the 50 mg were capsules that could not be cut.  [name] states that if we can't help him find something that he can afford he wishes to cancel all future appts because he just simply can't afford it. Please advise. Thanks.

## 2016-12-01 NOTE — Telephone Encounter (Signed)
Spoke with patient and tried to help him about the myrbetriq I offered patient an appointment so we could try and figure out what else he could be put on that he could afford because we are still out of myrbetriq 25 and he was very disrespectful to me. Started the whole conversation off yelling at me and when I asked him to please calm down and stop shouting that I was only trying to help he told me that [name] stated we needed to call them and find out what he could afford. I explained that he needed to get that information to [place] of what the insurance would pay for and he said hell no that was my responsibility and he was just going to forget the whole damn thing and never come back again and he started cursing me out and I hung up on the patient.

## 2016-12-05 ENCOUNTER — Telehealth: Payer: Self-pay | Admitting: Urology

## 2016-12-05 NOTE — Telephone Encounter (Signed)
I gave paper to [facility] in [place] this morning.  Please call Mr Honor [phone] and let him know if Rx has been sent to [facility] (Fax# [phone])

## 2016-12-05 NOTE — Telephone Encounter (Signed)
Pt called this morning asking about an Rx for Oxybutnin, instead of Myrbetriq, which was too expensive.  He was told we didn't have any 25 mg samples and you cannot half the 50 mg tablets.  There was a form faxed to [place] from [place].

## 2016-12-05 NOTE — Telephone Encounter (Signed)
Spoke with patient and let him know that as soon as Larene Beach gives me the ok and paper to fax to [facility] I will give him a call to let him know that I have done so. Patient is a very disrespectful and argumentative person. I listened to him rant and tell me 3 different lies about the girl he spoke to on Friday (which was me) I finally let patient know that I would give him a call as soon as I knew something and to have a good day and I ended the call.

## 2016-12-06 NOTE — Telephone Encounter (Signed)
Per Zara Council patient is being discharded from Practice because of his behavior and will get a letter and per Larene Beach she will call patient.

## 2016-12-06 NOTE — Telephone Encounter (Signed)
Pt called back again.  He is waiting on a return call.

## 2016-12-07 ENCOUNTER — Encounter: Payer: Self-pay | Admitting: Urology

## 2017-03-19 DIAGNOSIS — N521 Erectile dysfunction due to diseases classified elsewhere: Secondary | ICD-10-CM | POA: Insufficient documentation

## 2017-11-27 ENCOUNTER — Other Ambulatory Visit: Payer: Medicare HMO

## 2017-11-29 ENCOUNTER — Ambulatory Visit: Payer: Medicare HMO | Admitting: Urology

## 2019-07-10 ENCOUNTER — Ambulatory Visit: Payer: Medicare Other | Attending: Internal Medicine

## 2019-07-10 DIAGNOSIS — Z23 Encounter for immunization: Secondary | ICD-10-CM

## 2019-07-10 NOTE — Progress Notes (Signed)
   Covid-19 Vaccination Clinic  Name:  [name]    MRN: [id number] DOB: 11-09-1945  07/10/2019  [name] was observed post Covid-19 immunization for 15 minutes without incidence. He was provided with Vaccine Information Sheet and instruction to access the V-Safe system.   [name] was instructed to call 911 with any severe reactions post vaccine: Marland Kitchen Difficulty breathing  . Swelling of your face and throat  . A fast heartbeat  . A bad rash all over your body  . Dizziness and weakness    Immunizations Administered    Name Date Dose VIS Date Route   Pfizer COVID-19 Vaccine 07/10/2019  8:44 AM 0.3 mL 04/25/2019 Intramuscular   Manufacturer: [place]   Lot: [id number]   [place]: [id number]

## 2019-08-05 ENCOUNTER — Ambulatory Visit: Payer: Medicare Other | Attending: Internal Medicine

## 2019-08-05 DIAGNOSIS — Z23 Encounter for immunization: Secondary | ICD-10-CM

## 2019-08-05 NOTE — Progress Notes (Signed)
   Covid-19 Vaccination Clinic  Name:  [name]    MRN: [id number] DOB: [birth year]  08/05/2019  [name] was observed post Covid-19 immunization for 15 minutes without incident. He was provided with Vaccine Information Sheet and instruction to access the V-Safe system.   [name] was instructed to call 911 with any severe reactions post vaccine: Marland Kitchen Difficulty breathing  . Swelling of face and throat  . A fast heartbeat  . A bad rash all over body  . Dizziness and weakness   Immunizations Administered    Name Date Dose VIS Date Route   Pfizer COVID-19 Vaccine 08/05/2019  8:51 AM 0.3 mL 04/25/2019 Intramuscular   Manufacturer: [place]   Lot: [id number]   [place]: [id number]

## 2019-11-04 ENCOUNTER — Inpatient Hospital Stay
Admission: AD | Admit: 2019-11-04 | Discharge: 2019-11-06 | DRG: 066 | Disposition: A | Discharge status: Home Health | Payer: Medicare Other | Attending: Internal Medicine | Admitting: Internal Medicine

## 2019-11-04 ENCOUNTER — Emergency Department: Payer: Medicare Other

## 2019-11-04 ENCOUNTER — Other Ambulatory Visit: Payer: Self-pay

## 2019-11-04 DIAGNOSIS — Z20822 Contact with and (suspected) exposure to covid-19: Secondary | ICD-10-CM | POA: Diagnosis present

## 2019-11-04 DIAGNOSIS — H532 Diplopia: Secondary | ICD-10-CM

## 2019-11-04 DIAGNOSIS — E1151 Type 2 diabetes mellitus with diabetic peripheral angiopathy without gangrene: Secondary | ICD-10-CM | POA: Diagnosis present

## 2019-11-04 DIAGNOSIS — I639 Cerebral infarction, unspecified: Principal | ICD-10-CM | POA: Diagnosis present

## 2019-11-04 DIAGNOSIS — R29701 NIHSS score 1: Secondary | ICD-10-CM | POA: Diagnosis present

## 2019-11-04 DIAGNOSIS — Z888 Allergy status to other drugs, medicaments and biological substances status: Secondary | ICD-10-CM

## 2019-11-04 DIAGNOSIS — E119 Type 2 diabetes mellitus without complications: Secondary | ICD-10-CM

## 2019-11-04 DIAGNOSIS — Z9861 Coronary angioplasty status: Secondary | ICD-10-CM

## 2019-11-04 DIAGNOSIS — G4730 Sleep apnea, unspecified: Secondary | ICD-10-CM | POA: Diagnosis present

## 2019-11-04 DIAGNOSIS — E7800 Pure hypercholesterolemia, unspecified: Secondary | ICD-10-CM | POA: Diagnosis present

## 2019-11-04 DIAGNOSIS — Z7984 Long term (current) use of oral hypoglycemic drugs: Secondary | ICD-10-CM

## 2019-11-04 DIAGNOSIS — I6523 Occlusion and stenosis of bilateral carotid arteries: Secondary | ICD-10-CM | POA: Diagnosis present

## 2019-11-04 DIAGNOSIS — E785 Hyperlipidemia, unspecified: Secondary | ICD-10-CM | POA: Diagnosis present

## 2019-11-04 DIAGNOSIS — N400 Enlarged prostate without lower urinary tract symptoms: Secondary | ICD-10-CM | POA: Diagnosis present

## 2019-11-04 DIAGNOSIS — Z7982 Long term (current) use of aspirin: Secondary | ICD-10-CM

## 2019-11-04 DIAGNOSIS — E1159 Type 2 diabetes mellitus with other circulatory complications: Secondary | ICD-10-CM

## 2019-11-04 DIAGNOSIS — F17200 Nicotine dependence, unspecified, uncomplicated: Secondary | ICD-10-CM | POA: Diagnosis present

## 2019-11-04 DIAGNOSIS — Z79899 Other long term (current) drug therapy: Secondary | ICD-10-CM

## 2019-11-04 DIAGNOSIS — I10 Essential (primary) hypertension: Secondary | ICD-10-CM | POA: Diagnosis present

## 2019-11-04 DIAGNOSIS — K219 Gastro-esophageal reflux disease without esophagitis: Secondary | ICD-10-CM | POA: Diagnosis present

## 2019-11-04 DIAGNOSIS — E1169 Type 2 diabetes mellitus with other specified complication: Secondary | ICD-10-CM

## 2019-11-04 DIAGNOSIS — Z85828 Personal history of other malignant neoplasm of skin: Secondary | ICD-10-CM

## 2019-11-04 DIAGNOSIS — J449 Chronic obstructive pulmonary disease, unspecified: Secondary | ICD-10-CM | POA: Diagnosis present

## 2019-11-04 DIAGNOSIS — I2510 Atherosclerotic heart disease of native coronary artery without angina pectoris: Secondary | ICD-10-CM | POA: Diagnosis present

## 2019-11-04 LAB — CBC WITH DIFFERENTIAL/PLATELET
Abs Immature Granulocytes: 0.09 10*3/uL — ABNORMAL HIGH (ref 0.00–0.07)
Basophils Absolute: 0.1 10*3/uL (ref 0.0–0.1)
Basophils Relative: 1 %
Eosinophils Absolute: 0.1 10*3/uL (ref 0.0–0.5)
Eosinophils Relative: 1 %
HCT: 49.5 % (ref 39.0–52.0)
Hemoglobin: 17.2 g/dL — ABNORMAL HIGH (ref 13.0–17.0)
Immature Granulocytes: 1 %
Lymphocytes Relative: 15 %
Lymphs Abs: 2 10*3/uL (ref 0.7–4.0)
MCH: 30.8 pg (ref 26.0–34.0)
MCHC: 34.7 g/dL (ref 30.0–36.0)
MCV: 88.6 fL (ref 80.0–100.0)
Monocytes Absolute: 0.7 10*3/uL (ref 0.1–1.0)
Monocytes Relative: 5 %
Neutro Abs: 10.5 10*3/uL — ABNORMAL HIGH (ref 1.7–7.7)
Neutrophils Relative %: 77 %
Platelets: 185 10*3/uL (ref 150–400)
RBC: 5.59 MIL/uL (ref 4.22–5.81)
RDW: 13 % (ref 11.5–15.5)
WBC: 13.4 10*3/uL — ABNORMAL HIGH (ref 4.0–10.5)
nRBC: 0 % (ref 0.0–0.2)

## 2019-11-04 LAB — COMPREHENSIVE METABOLIC PANEL
ALT: 38 U/L (ref 0–44)
AST: 35 U/L (ref 15–41)
Albumin: 4.7 g/dL (ref 3.5–5.0)
Alkaline Phosphatase: 65 U/L (ref 38–126)
Anion gap: 12 (ref 5–15)
BUN: 12 mg/dL (ref 8–23)
CO2: 25 mmol/L (ref 22–32)
Calcium: 9.4 mg/dL (ref 8.9–10.3)
Chloride: 101 mmol/L (ref 98–111)
Creatinine, Ser: 0.73 mg/dL (ref 0.61–1.24)
GFR calc Af Amer: >60 mL/min (ref >60)
GFR calc non Af Amer: >60 mL/min (ref >60)
Glucose, Bld: 205 mg/dL — ABNORMAL HIGH (ref 70–99)
Potassium: 4.3 mmol/L (ref 3.5–5.1)
Sodium: 138 mmol/L (ref 135–145)
Total Bilirubin: 1.2 mg/dL (ref 0.3–1.2)
Total Protein: 7.8 g/dL (ref 6.5–8.1)

## 2019-11-04 LAB — SARS CORONAVIRUS 2 BY RT PCR (HOSPITAL ORDER, PERFORMED IN ~~LOC~~ HOSPITAL LAB): SARS Coronavirus 2: NEGATIVE

## 2019-11-04 LAB — PROTIME-INR
INR: 1 (ref 0.8–1.2)
Prothrombin Time: 12.8 seconds (ref 11.4–15.2)

## 2019-11-04 LAB — APTT: aPTT: 32 seconds (ref 24–36)

## 2019-11-04 MED ORDER — ACETAMINOPHEN 325 MG PO TABS: 650.0000 mg | ORAL_TABLET | ORAL | Status: DC | PRN

## 2019-11-04 MED ORDER — INSULIN ASPART 100 UNIT/ML ~~LOC~~ SOLN
0.0000 [IU] | Freq: Three times a day (TID) | SUBCUTANEOUS | Status: DC
  Administered 2019-11-05: 5 [IU] via SUBCUTANEOUS
  Administered 2019-11-05: 3 [IU] via SUBCUTANEOUS
  Administered 2019-11-05: 18:00:00 5 [IU] via SUBCUTANEOUS
  Administered 2019-11-06: 08:00:00 3 [IU] via SUBCUTANEOUS
  Filled 2019-11-04 (×4): qty 1

## 2019-11-04 MED ORDER — SODIUM CHLORIDE 0.9 % IV SOLN: INTRAVENOUS | Status: DC

## 2019-11-04 MED ORDER — SODIUM CHLORIDE 0.9% FLUSH: 3.0000 mL | Freq: Once | INTRAVENOUS | Status: DC

## 2019-11-04 MED ORDER — ASPIRIN 81 MG PO CHEW
324.0000 mg | CHEWABLE_TABLET | Freq: Once | ORAL | Status: AC
  Administered 2019-11-04: 324 mg via ORAL
  Filled 2019-11-04: qty 4

## 2019-11-04 MED ORDER — ACETAMINOPHEN 650 MG RE SUPP: 650.0000 mg | RECTAL | Status: DC | PRN

## 2019-11-04 MED ORDER — ACETAMINOPHEN 160 MG/5ML PO SOLN
650.0000 mg | ORAL | Status: DC | PRN
  Filled 2019-11-04: qty 20.3

## 2019-11-04 MED ORDER — STROKE: EARLY STAGES OF RECOVERY BOOK: Freq: Once | Status: AC

## 2019-11-04 MED ORDER — SENNOSIDES-DOCUSATE SODIUM 8.6-50 MG PO TABS: 1.0000 | ORAL_TABLET | Freq: Every evening | ORAL | Status: DC | PRN

## 2019-11-04 MED ORDER — INSULIN ASPART 100 UNIT/ML ~~LOC~~ SOLN
0.0000 [IU] | Freq: Every day | SUBCUTANEOUS | Status: DC
  Administered 2019-11-05: 2 [IU] via SUBCUTANEOUS
  Filled 2019-11-04: qty 1

## 2019-11-04 MED ORDER — ENOXAPARIN SODIUM 40 MG/0.4ML ~~LOC~~ SOLN
40.0000 mg | SUBCUTANEOUS | Status: DC
  Administered 2019-11-05 – 2019-11-06 (×2): 40 mg via SUBCUTANEOUS
  Filled 2019-11-04 (×2): qty 0.4

## 2019-11-04 NOTE — ED Notes (Signed)
Pt taken to MRI  

## 2019-11-04 NOTE — H&P (Signed)
History and Physical   [name] KGM:[id number] DOB: 1946/04/09 DOA: 11/04/2019  Referring MD/NP/PA: Dr. Jimmye Norman  PCP: Raelene Bott, MD   Outpatient Specialists: None  Patient coming from: Home  Chief Complaint: Diplopia  HPI: [name] is a 74 y.o. male with medical history significant of COPD, diabetes, hypertension, coronary artery disease, hyperlipidemia, obstructive sleep apnea and peripheral vascular disease who presents with acute onset of double vision and blurred vision.  This happened around 11 AM in the morning.  Patient realized when he covers his right eye the vision is now normal.  No headaches.  No focal weakness.  He has no prior CVAs.  Patient denied any headache.  In the ER he was evaluated.  CT head without contrast was negative but MRI of the brain showed focal infarct involving the right oculomotor nucleus.  Neurology consulted with recommendation for aspirin therapy mainly.  Patient being admitted for further treatment..  ED Course: Vitals are stable with a blood pressure of 144/73.  Pulse was 88.  His white count is 13.4 otherwise hemoglobin 17.2.  The rest of the chemistry and CBC within normal.  Glucose is 205.  INR 1.0.  Head CT without contrast negative MRI of the brain showing the right oculomotor nucleus infarct.  MRA of the brain is negative.  EKG showed no acute findings.  Patient being admitted with acute CVA involving mainly the oculomotor nerve.  Review of Systems: As per HPI otherwise 10 point review of systems negative.    Past Medical History:  Diagnosis Date  . COPD (chronic obstructive pulmonary disease) ([place])   . Coronary artery disease   . Diabetes mellitus without complication ([place])   . Gastric ulcer with hemorrhage   . GERD (gastroesophageal reflux disease)   . Headache   . Hypercholesterolemia   . Peripheral vascular disease ([place])   . Prostate enlargement   . Skin cancer   . Sleep apnea     Past Surgical History:  Procedure  Laterality Date  . CHOLECYSTECTOMY    . circumscision     from [employer] surgery for epididymitis  . COLONOSCOPY WITH PROPOFOL N/A 07/28/2015   Procedure: COLONOSCOPY WITH PROPOFOL;  Surgeon: Manya Silvas, MD;  Location: [facility] ENDOSCOPY;  Service: Endoscopy;  Laterality: N/A;  . CORONARY ANGIOPLASTY  x2  . HERNIA REPAIR    . stomach ulcer surgery       reports that he has been smoking. He has never used smokeless tobacco. He reports current alcohol use. He reports that he does not use drugs.  Allergies  Allergen Reactions  . Iodine     Family History  Problem Relation Age of Onset  . Kidney cancer Neg Hx   . Bladder Cancer Neg Hx   . Prostate cancer Neg Hx      Prior to Admission medications   Medication Sig Start Date End Date Taking? Authorizing Provider  ACETAMINOPHEN-CAFFEINE PO Take by mouth.    [provider]  aspirin EC 81 MG tablet Take 81 mg by mouth daily.    [provider]  aspirin-acetaminophen-caffeine (EXCEDRIN MIGRAINE) [phone] MG tablet Take by mouth.    [provider]  azelastine (ASTELIN) 0.1 % nasal spray 2 sprays by Each Nare route Two (2) times a day. 09/11/16 09/11/17  [provider]  ibuprofen (ADVIL,MOTRIN) 200 MG tablet Take 400 mg by mouth daily.    [provider]  LACTOBACILLUS RHAMNOSUS, GG, PO Take 1 capsule by mouth.  [provider]  lisinopril (PRINIVIL,ZESTRIL) 10 MG tablet Take 10 mg by mouth daily.      [provider]  metFORMIN (GLUCOPHAGE) 500 MG tablet Take 500 mg by mouth 2 (two) times daily with a meal.    [provider]  mirabegron ER (MYRBETRIQ) 25 MG TB24 tablet Take 1 tablet (25 mg total) by mouth daily. 11/28/16   Zara Council A, PA-C  mirabegron ER (MYRBETRIQ) 25 MG TB24 tablet Take 1 tablet (25 mg total) by mouth daily. 11/29/16   Zara Council A, PA-C  Multiple Vitamin (MULTI-VITAMINS) TABS Take by mouth.    [provider]  Multiple  Vitamin (MULTIVITAMIN) tablet Take 1 tablet by mouth daily.    [provider]  pantoprazole (PROTONIX) 40 MG tablet Take 40 mg by mouth daily.    [provider]  simvastatin (ZOCOR) 40 MG tablet Take 40 mg by mouth daily.    [provider]  tamsulosin (FLOMAX) 0.4 MG CAPS capsule Take 0.4 mg by mouth.    [provider]  triamcinolone (KENALOG) 0.025 % cream Place into the nose.    [provider]    Physical Exam: Vitals:   11/04/19 1740 11/04/19 1744 11/04/19 1900 11/04/19 2000  BP: 134/88  134/78 116/74  Pulse: 88   64  Resp: 18  20 20   Temp:  98 F (36.7 C)    TempSrc: Oral     SpO2: 96%   95%  Weight: 124.7 kg     Height: 6' (1.829 m)         Constitutional: NAD, calm, comfortable Vitals:   11/04/19 1740 11/04/19 1744 11/04/19 1900 11/04/19 2000  BP: 134/88  134/78 116/74  Pulse: 88   64  Resp: 18  20 20   Temp:  98 F (36.7 C)    TempSrc: Oral     SpO2: 96%   95%  Weight: 124.7 kg     Height: 6' (1.829 m)      Eyes: PERRL, lids and conjunctivae normal ENMT: Mucous membranes are moist. Posterior pharynx clear of any exudate or lesions.Normal dentition.  Neck: normal, supple, no masses, no thyromegaly Respiratory: clear to auscultation bilaterally, no wheezing, no crackles. Normal respiratory effort. No accessory muscle use.  Cardiovascular: Regular rate and rhythm, no murmurs / rubs / gallops. No extremity edema. 2+ pedal pulses. No carotid bruits.  Abdomen: no tenderness, no masses palpated. No hepatosplenomegaly. Bowel sounds positive.  Musculoskeletal: no clubbing / cyanosis. No joint deformity upper and lower extremities. Good ROM, no contractures. Normal muscle tone.  Skin: no rashes, lesions, ulcers. No induration Neurologic: CN 2-12 grossly intact. Sensation intact, DTR normal. Strength 5/5 in all 4.  Psychiatric: Normal judgment and insight. Alert and oriented x 3. Normal mood.     Labs on Admission: I have  personally reviewed following labs and imaging studies  CBC: Recent Labs  Lab 11/04/19 1850  WBC 13.4*  NEUTROABS 10.5*  HGB 17.2*  HCT 49.5  MCV 88.6  PLT 308   Basic Metabolic Panel: Recent Labs  Lab 11/04/19 1850  NA 138  K 4.3  CL 101  CO2 25  GLUCOSE 205*  BUN 12  CREATININE 0.73  CALCIUM 9.4   GFR: Estimated Creatinine Clearance: 113.8 mL/min (by C-G formula based on SCr of 0.73 mg/dL). Liver Function Tests: Recent Labs  Lab 11/04/19 1850  AST 35  ALT 38  ALKPHOS 65  BILITOT 1.2  PROT 7.8  ALBUMIN 4.7   No  results for input(s): LIPASE, AMYLASE in the last 168 hours. No results for input(s): AMMONIA in the last 168 hours. Coagulation Profile: Recent Labs  Lab 11/04/19 1850  INR 1.0   Cardiac Enzymes: No results for input(s): CKTOTAL, CKMB, CKMBINDEX, TROPONINI in the last 168 hours. BNP (last 3 results) No results for input(s): PROBNP in the last 8760 hours. HbA1C: No results for input(s): HGBA1C in the last 72 hours. CBG: No results for input(s): GLUCAP in the last 168 hours. Lipid Profile: No results for input(s): CHOL, HDL, LDLCALC, TRIG, CHOLHDL, LDLDIRECT in the last 72 hours. Thyroid Function Tests: No results for input(s): TSH, T4TOTAL, FREET4, T3FREE, THYROIDAB in the last 72 hours. Anemia Panel: No results for input(s): VITAMINB12, FOLATE, FERRITIN, TIBC, IRON, RETICCTPCT in the last 72 hours. Urine analysis:    Component Value Date/Time   COLORURINE Amber 04/20/2012 0518   COLORURINE YELLOW 10/03/2007 0953   APPEARANCEUR Turbid 04/20/2012 0518   LABSPEC >1.060 04/20/2012 0518   PHURINE 5.0 04/20/2012 0518   PHURINE 5.5 10/03/2007 0953   GLUCOSEU 50 mg/dL 04/20/2012 0518   HGBUR 2+ 04/20/2012 0518   HGBUR NEGATIVE 10/03/2007 0953   BILIRUBINUR Negative 04/20/2012 0518   KETONESUR Negative 04/20/2012 0518   KETONESUR NEGATIVE 10/03/2007 0953   PROTEINUR 100 mg/dL 04/20/2012 0518   PROTEINUR NEGATIVE 10/03/2007 0953    UROBILINOGEN 0.2 10/03/2007 0953   NITRITE Negative 04/20/2012 0518   NITRITE NEGATIVE 10/03/2007 0953   LEUKOCYTESUR Negative 04/20/2012 0518   Sepsis Labs: @LABRCNTIP (procalcitonin:4,lacticidven:4) )No results found for this or any previous visit (from the past 240 hour(s)).   Radiological Exams on Admission: CT HEAD WO CONTRAST  Result Date: 11/04/2019 CLINICAL DATA:  Double vision EXAM: CT HEAD WITHOUT CONTRAST TECHNIQUE: Contiguous axial images were obtained from the base of the skull through the vertex without intravenous contrast. COMPARISON:  None. FINDINGS: Brain: No acute territorial infarction, hemorrhage, or intracranial mass. Mild atrophy. Mild hypodensity in the white matter likely chronic small vessel ischemic change. Slightly prominent ventricles felt secondary to atrophy. Vascular: No hyperdense vessels.  Carotid vascular calcification Skull: Normal. Negative for fracture or focal lesion. Sinuses/Orbits: No acute finding. Other: Punctate densities/possible small foreign bodies within the soft tissues superficial to the left lateral orbital wall. IMPRESSION: 1. No CT evidence for acute intracranial abnormality. 2. Atrophy and minimal chronic small vessel ischemic change of the white matter Electronically Signed   By: Donavan Foil M.D.   On: 11/04/2019 18:26   MR ANGIO HEAD WO CONTRAST  Result Date: 11/04/2019 CLINICAL DATA:  Double vision EXAM: MRI HEAD WITHOUT CONTRAST MRA HEAD WITHOUT CONTRAST TECHNIQUE: Multiplanar, multiecho pulse sequences of the brain and surrounding structures were obtained without intravenous contrast. Angiographic images of the head were obtained using MRA technique without contrast. COMPARISON:  CT head 11/04/2019 FINDINGS: MRI HEAD FINDINGS Brain: Small focus of restricted diffusion in the right oculomotor nucleus anterior and to the right of the aqueduct. No other acute infarct. Scattered small white matter hyperintensities, mild. Negative for hemorrhage  or mass. Ventricles are enlarged due to mild atrophy. Vascular: Normal arterial flow voids. Skull and upper cervical spine: Normal bone marrow. Sinuses/Orbits: Mild mucosal edema paranasal sinuses. Bilateral cataract extraction Other: None MRA HEAD FINDINGS Internal carotid artery widely patent bilaterally. Anterior and middle cerebral arteries widely patent bilaterally without stenosis or occlusion. Both vertebral arteries widely patent. PICA patent bilaterally. Basilar widely patent. Superior cerebellar and posterior cerebral arteries patent bilaterally without stenosis. Negative for cerebral aneurysm. IMPRESSION: Small acute infarct  in the right oculomotor nucleus causing diplopia. Mild chronic microvascular ischemia and mild atrophy Negative MRA head Electronically Signed   By: Franchot Gallo M.D.   On: 11/04/2019 21:08   MR BRAIN WO CONTRAST  Result Date: 11/04/2019 CLINICAL DATA:  Double vision EXAM: MRI HEAD WITHOUT CONTRAST MRA HEAD WITHOUT CONTRAST TECHNIQUE: Multiplanar, multiecho pulse sequences of the brain and surrounding structures were obtained without intravenous contrast. Angiographic images of the head were obtained using MRA technique without contrast. COMPARISON:  CT head 11/04/2019 FINDINGS: MRI HEAD FINDINGS Brain: Small focus of restricted diffusion in the right oculomotor nucleus anterior and to the right of the aqueduct. No other acute infarct. Scattered small white matter hyperintensities, mild. Negative for hemorrhage or mass. Ventricles are enlarged due to mild atrophy. Vascular: Normal arterial flow voids. Skull and upper cervical spine: Normal bone marrow. Sinuses/Orbits: Mild mucosal edema paranasal sinuses. Bilateral cataract extraction Other: None MRA HEAD FINDINGS Internal carotid artery widely patent bilaterally. Anterior and middle cerebral arteries widely patent bilaterally without stenosis or occlusion. Both vertebral arteries widely patent. PICA patent bilaterally. Basilar  widely patent. Superior cerebellar and posterior cerebral arteries patent bilaterally without stenosis. Negative for cerebral aneurysm. 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 Mild chronic microvascular ischemia and mild atrophy Negative MRA head Electronically Signed   By: Franchot Gallo M.D.   On: 11/04/2019 21:08    EKG: Independently reviewed.  Sinus rhythm with no significant ST changes  Assessment/Plan Principal Problem:   Acute cerebrovascular accident (CVA) ([place]) Active Problems:   TOBACCO USER   HYPERTENSION, BENIGN ESSENTIAL   DM (diabetes mellitus) ([place])   Hyperlipidemia     #1 acute CVA: Involving the right oculomotor nerve nucleus.  Patient still has diplopia.  Will initiate aspirin.  Continue with high intensity statin.  Get echocardiogram.  Patient already has MRI.  Will get also carotid Dopplers.  Hopefully patient will have his vision improved.  PTSD eval for full CVA.   #2 hypertension: Continue home regimen  #3 diabetes: Resume home regimen with sliding scale insulin  #4 hyperlipidemia: Continue with statin  #5 history of tobacco abuse: Counseling provided.  Offered nicotine patch   DVT prophylaxis: Lovenox Code Status: Full code Family Communication: None at bedside Disposition Plan: Home Consults called: Neurology Admission status: Observation  Severity of Illness: The appropriate patient status for this patient is OBSERVATION. Observation status is judged to be reasonable and necessary in order to provide the required intensity of service to ensure the patient's safety. The patient's presenting symptoms, physical exam findings, and initial radiographic and laboratory data in the context of their medical condition is felt to place them at decreased risk for further clinical deterioration. Furthermore, it is anticipated that the patient will be medically stable for discharge from the hospital within 2 midnights of  admission. The following factors support the patient status of observation.   " The patient's presenting symptoms include double vision. " The physical exam findings include no nystagmus. " The initial radiographic and laboratory data are evidence of CVA.     Barbette Merino MD [facility] Pager [phone]  If 7PM-7AM, please contact night-coverage [email or web address] Password [facility]  11/04/2019, 9:42 PM

## 2019-11-04 NOTE — ED Notes (Signed)
Pt given turkey sandwich tray 

## 2019-11-04 NOTE — ED Triage Notes (Signed)
Pt c/o sudden onset of double vision today while out shopping at 11am, states when he covers one eye the double vision is gone. Denies any weakness or HA. Pt is having nausea.

## 2019-11-04 NOTE — ED Provider Notes (Signed)
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 Mild chronic microvascular ischemia and mild atrophy  Negative MRA head  MRI findings as dictated above which explains his diplopia.  He will be given a full dose aspirin and I will discuss with neurology and the hospitalist for admission.   Earleen Newport, MD 11/04/19 2117

## 2019-11-04 NOTE — ED Provider Notes (Signed)
[facility] Emergency Department Provider Note  Time seen: 7:11 PM  I have reviewed the triage vital signs and the nursing notes.   HISTORY  Chief Complaint Diplopia   HPI [name] is a 74 y.o. male with a past medical history of COPD, CAD, diabetes, gastric reflux, hyperlipidemia presents to the emergency department for acute onset of blurred/double vision.   According to the patient at 11 AM today he had acute onset of blurred and double vision.  States if he covers one eye his vision is normal.  Denies any headache.  Denies any numbness or weakness of any arm or leg confusion or slurred speech.  No history of CVA previously.  Past Medical History:  Diagnosis Date  . COPD (chronic obstructive pulmonary disease) ([place])   . Coronary artery disease   . Diabetes mellitus without complication ([place])   . Gastric ulcer with hemorrhage   . GERD (gastroesophageal reflux disease)   . Headache   . Hypercholesterolemia   . Peripheral vascular disease ([place])   . Prostate enlargement   . Skin cancer   . Sleep apnea     Patient Active Problem List   Diagnosis Date Noted  . TOBACCO USER 02/20/2009  . HYPERTENSION, BENIGN ESSENTIAL 12/02/2007  . PULMONARY EMBOLISM 12/02/2007  . WOUND, LEG, WITH COMPLICATION 44/31/5400    Past Surgical History:  Procedure Laterality Date  . CHOLECYSTECTOMY    . circumscision     from [employer] surgery for epididymitis  . COLONOSCOPY WITH PROPOFOL N/A 07/28/2015   Procedure: COLONOSCOPY WITH PROPOFOL;  Surgeon: Manya Silvas, MD;  Location: [facility] ENDOSCOPY;  Service: Endoscopy;  Laterality: N/A;  . CORONARY ANGIOPLASTY  x2  . HERNIA REPAIR    . stomach ulcer surgery      Prior to Admission medications   Medication Sig Start Date End Date Taking? Authorizing Provider  ACETAMINOPHEN-CAFFEINE PO Take by mouth.    [provider]  aspirin EC 81 MG tablet Take 81 mg by mouth daily.    [provider]   aspirin-acetaminophen-caffeine (EXCEDRIN MIGRAINE) [phone] MG tablet Take by mouth.    [provider]  azelastine (ASTELIN) 0.1 % nasal spray 2 sprays by Each Nare route Two (2) times a day. 09/11/16 09/11/17  [provider]  ibuprofen (ADVIL,MOTRIN) 200 MG tablet Take 400 mg by mouth daily.    [provider]  LACTOBACILLUS RHAMNOSUS, GG, PO Take 1 capsule by mouth.    [provider]  lisinopril (PRINIVIL,ZESTRIL) 10 MG tablet Take 10 mg by mouth daily.      [provider]  metFORMIN (GLUCOPHAGE) 500 MG tablet Take 500 mg by mouth 2 (two) times daily with a meal.    [provider]  mirabegron ER (MYRBETRIQ) 25 MG TB24 tablet Take 1 tablet (25 mg total) by mouth daily. 11/28/16   Zara Council A, PA-C  mirabegron ER (MYRBETRIQ) 25 MG TB24 tablet Take 1 tablet (25 mg total) by mouth daily. 11/29/16   Zara Council A, PA-C  Multiple Vitamin (MULTI-VITAMINS) TABS Take by mouth.    [provider]  Multiple Vitamin (MULTIVITAMIN) tablet Take 1 tablet by mouth daily.    [provider]  pantoprazole (PROTONIX) 40 MG tablet Take 40 mg by mouth daily.    [provider]  simvastatin (ZOCOR) 40 MG tablet Take 40 mg by mouth daily.    [provider]  tamsulosin (FLOMAX) 0.4 MG CAPS capsule Take 0.4 mg by mouth.  [provider]  triamcinolone (KENALOG) 0.025 % cream Place into the nose.    [provider]    Allergies  Allergen Reactions  . Iodine     Family History  Problem Relation Age of Onset  . Kidney cancer Neg Hx   . Bladder Cancer Neg Hx   . Prostate cancer Neg Hx     Social History Social History   Tobacco Use  . Smoking status: Current Every Day Smoker  . Smokeless tobacco: Never Used  Substance Use Topics  . Alcohol use: Yes  . Drug use: No    Review of Systems Constitutional: Negative for fever. Eyes: Positive for blurred and double  vision. Cardiovascular: Negative for chest pain. Respiratory: Negative for shortness of breath. Gastrointestinal: Negative for abdominal pain Musculoskeletal: Negative for musculoskeletal complaints Neurological: Double vision/blurred vision. All other ROS negative  ____________________________________________   PHYSICAL EXAM:  VITAL SIGNS: ED Triage Vitals  Enc Vitals Group     BP 11/04/19 1740 134/88     Pulse Rate 11/04/19 1740 88     Resp 11/04/19 1740 18     Temp 11/04/19 1744 98 F (36.7 C)     Temp Source 11/04/19 1740 Oral     SpO2 11/04/19 1740 96 %     Weight 11/04/19 1740 275 lb (124.7 kg)     Height 11/04/19 1740 6' (1.829 m)     Head Circumference --      Peak Flow --      Pain Score 11/04/19 1740 0     Pain Loc --      Pain Edu? --      Excl. in [place]? --    Constitutional: Alert and oriented. Well appearing and in no distress. 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  On closer examination with extraocular muscles he appears to have difficulty adducting his right eye past midline.  2 mm pupils reactive. ENT      Head: Normocephalic and atraumatic.      Mouth/Throat: Mucous membranes are moist. Cardiovascular: Normal rate, regular rhythm.  Respiratory: Normal respiratory effort without tachypnea nor retractions. Breath sounds are clear  Gastrointestinal: Soft and nontender. No distention. Musculoskeletal: Nontender with normal range of motion in all extremities. Neurologic:  Normal speech and language.  Equal grip strength bilaterally without pronator drift.  Normal leg strength bilaterally without drift.  Sensation intact and equal.  Patient unable to adduct right eye past midline. Skin:  Skin is warm, dry and intact.  Psychiatric: Mood and affect are normal.   ____________________________________________    EKG  EKG viewed and interpreted by myself shows a normal sinus rhythm 87 bpm with a slightly widened QRS.  Left axis deviation.  Largely normal  intervals with nonspecific ST changes  ____________________________________________    RADIOLOGY  CT shows no acute abnormality.  ____________________________________________   INITIAL IMPRESSION / ASSESSMENT AND PLAN / ED COURSE  Pertinent labs & imaging results that were available during my care of the patient were reviewed by me and considered in my medical decision making (see chart for details).   Patient presents emergency department for acute onset of blurred vision.  Obvious cranial nerve/ocular ocular muscle deficit on exam unable to look medially with right eye.  CT scan head shows no acute abnormality.  We will proceed with MRI/MRA of the brain to evaluate for possible CVA or aneurysmal dilation.  Patient agreeable to plan of care.  MRI pending.  Pt care signed out to Dr.  Lan Mcneill was evaluated in Emergency Department on 11/04/2019 for the symptoms described in the history of present illness. He was evaluated in the context of the global COVID-19 pandemic, which necessitated consideration that the patient might be at risk for infection with the SARS-CoV-2 virus that causes COVID-19. Institutional protocols and algorithms that pertain to the evaluation of patients at risk for COVID-19 are in a state of rapid change based on information released by regulatory bodies including the CDC and federal and state organizations. These policies and algorithms were followed during the patient's care in the ED.  NIH Stroke Scale   Interval: Baseline Time: 8:47 PM Person Administering Scale: Harvest Dark  Administer stroke scale items in the order listed. Record performance in each category after each subscale exam. Do not go back and change scores. Follow directions provided for each exam technique. Scores should reflect what the patient does, not what the clinician thinks the patient can do. The clinician should record answers while administering the exam and work  quickly. Except where indicated, the patient should not be coached (i.e., repeated requests to patient to make a special effort).   1a  Level of consciousness: 0=alert; keenly responsive  1b. LOC questions:  0=Performs both tasks correctly  1c. LOC commands: 0=Performs both tasks correctly  2.  Best Gaze: 0=normal  3.  Visual: 1=Partial hemianopia  4. Facial Palsy: 0=Normal symmetric movement  5a.  Motor left arm: 0=No drift, limb holds 90 (or 45) degrees for full 10 seconds  5b.  Motor right arm: 0=No drift, limb holds 90 (or 45) degrees for full 10 seconds  6a. motor left leg: 0=No drift, limb holds 90 (or 45) degrees for full 10 seconds  6b  Motor right leg:  0=No drift, limb holds 90 (or 45) degrees for full 10 seconds  7. Limb Ataxia: 0=Absent  8.  Sensory: 0=Normal; no sensory loss  9. Best Language:  0=No aphasia, normal  10. Dysarthria: 0=Normal  11. Extinction and Inattention: 0=No abnormality  12. Distal motor function: 0=Normal   Total:   1    ____________________________________________   FINAL CLINICAL IMPRESSION(S) / ED DIAGNOSES  Diplopia   Harvest Dark, MD 11/04/19 2047

## 2019-11-05 ENCOUNTER — Observation Stay
Admit: 2019-11-05 | Discharge: 2019-11-05 | Disposition: A | Discharge status: Home / Self Care | Payer: Medicare Other | Attending: Internal Medicine | Admitting: Internal Medicine

## 2019-11-05 ENCOUNTER — Observation Stay: Payer: Medicare Other

## 2019-11-05 DIAGNOSIS — N400 Enlarged prostate without lower urinary tract symptoms: Secondary | ICD-10-CM | POA: Diagnosis present

## 2019-11-05 DIAGNOSIS — K219 Gastro-esophageal reflux disease without esophagitis: Secondary | ICD-10-CM | POA: Diagnosis present

## 2019-11-05 DIAGNOSIS — I2510 Atherosclerotic heart disease of native coronary artery without angina pectoris: Secondary | ICD-10-CM | POA: Diagnosis present

## 2019-11-05 DIAGNOSIS — E785 Hyperlipidemia, unspecified: Secondary | ICD-10-CM | POA: Diagnosis present

## 2019-11-05 DIAGNOSIS — F17200 Nicotine dependence, unspecified, uncomplicated: Secondary | ICD-10-CM | POA: Diagnosis present

## 2019-11-05 DIAGNOSIS — Z888 Allergy status to other drugs, medicaments and biological substances status: Secondary | ICD-10-CM | POA: Diagnosis not present

## 2019-11-05 DIAGNOSIS — I10 Essential (primary) hypertension: Secondary | ICD-10-CM | POA: Diagnosis present

## 2019-11-05 DIAGNOSIS — Z9861 Coronary angioplasty status: Secondary | ICD-10-CM | POA: Diagnosis not present

## 2019-11-05 DIAGNOSIS — E7800 Pure hypercholesterolemia, unspecified: Secondary | ICD-10-CM | POA: Diagnosis present

## 2019-11-05 DIAGNOSIS — Z79899 Other long term (current) drug therapy: Secondary | ICD-10-CM | POA: Diagnosis not present

## 2019-11-05 DIAGNOSIS — Z7982 Long term (current) use of aspirin: Secondary | ICD-10-CM | POA: Diagnosis not present

## 2019-11-05 DIAGNOSIS — I639 Cerebral infarction, unspecified: Secondary | ICD-10-CM | POA: Diagnosis present

## 2019-11-05 DIAGNOSIS — E1151 Type 2 diabetes mellitus with diabetic peripheral angiopathy without gangrene: Secondary | ICD-10-CM | POA: Diagnosis present

## 2019-11-05 DIAGNOSIS — Z20822 Contact with and (suspected) exposure to covid-19: Secondary | ICD-10-CM | POA: Diagnosis present

## 2019-11-05 DIAGNOSIS — R29701 NIHSS score 1: Secondary | ICD-10-CM | POA: Diagnosis present

## 2019-11-05 DIAGNOSIS — Z85828 Personal history of other malignant neoplasm of skin: Secondary | ICD-10-CM | POA: Diagnosis not present

## 2019-11-05 DIAGNOSIS — G4730 Sleep apnea, unspecified: Secondary | ICD-10-CM | POA: Diagnosis present

## 2019-11-05 DIAGNOSIS — I6523 Occlusion and stenosis of bilateral carotid arteries: Secondary | ICD-10-CM | POA: Diagnosis present

## 2019-11-05 DIAGNOSIS — H532 Diplopia: Secondary | ICD-10-CM | POA: Diagnosis present

## 2019-11-05 DIAGNOSIS — J449 Chronic obstructive pulmonary disease, unspecified: Secondary | ICD-10-CM | POA: Diagnosis present

## 2019-11-05 DIAGNOSIS — Z7984 Long term (current) use of oral hypoglycemic drugs: Secondary | ICD-10-CM | POA: Diagnosis not present

## 2019-11-05 LAB — COMPREHENSIVE METABOLIC PANEL
ALT: 33 U/L (ref 0–44)
AST: 24 U/L (ref 15–41)
Albumin: 3.7 g/dL (ref 3.5–5.0)
Alkaline Phosphatase: 50 U/L (ref 38–126)
Anion gap: 9 (ref 5–15)
BUN: 11 mg/dL (ref 8–23)
CO2: 28 mmol/L (ref 22–32)
Calcium: 8.8 mg/dL — ABNORMAL LOW (ref 8.9–10.3)
Chloride: 103 mmol/L (ref 98–111)
Creatinine, Ser: 0.68 mg/dL (ref 0.61–1.24)
GFR calc Af Amer: >60 mL/min (ref >60)
GFR calc non Af Amer: >60 mL/min (ref >60)
Glucose, Bld: 170 mg/dL — ABNORMAL HIGH (ref 70–99)
Potassium: 3.8 mmol/L (ref 3.5–5.1)
Sodium: 140 mmol/L (ref 135–145)
Total Bilirubin: 0.9 mg/dL (ref 0.3–1.2)
Total Protein: 6.3 g/dL — ABNORMAL LOW (ref 6.5–8.1)

## 2019-11-05 LAB — LIPID PANEL
Cholesterol: 142 mg/dL (ref 0–200)
HDL: 30 mg/dL — ABNORMAL LOW (ref >40)
LDL Cholesterol: 76 mg/dL (ref 0–99)
Total CHOL/HDL Ratio: 4.7 RATIO
Triglycerides: 178 mg/dL — ABNORMAL HIGH (ref <150)
VLDL: 36 mg/dL (ref 0–40)

## 2019-11-05 LAB — GLUCOSE, CAPILLARY
Glucose-Capillary: 152 mg/dL — ABNORMAL HIGH (ref 70–99)
Glucose-Capillary: 157 mg/dL — ABNORMAL HIGH (ref 70–99)
Glucose-Capillary: 220 mg/dL — ABNORMAL HIGH (ref 70–99)
Glucose-Capillary: 233 mg/dL — ABNORMAL HIGH (ref 70–99)
Glucose-Capillary: 241 mg/dL — ABNORMAL HIGH (ref 70–99)

## 2019-11-05 LAB — CBC
HCT: 44 % (ref 39.0–52.0)
Hemoglobin: 14.9 g/dL (ref 13.0–17.0)
MCH: 30.8 pg (ref 26.0–34.0)
MCHC: 33.9 g/dL (ref 30.0–36.0)
MCV: 91.1 fL (ref 80.0–100.0)
Platelets: 180 10*3/uL (ref 150–400)
RBC: 4.83 MIL/uL (ref 4.22–5.81)
RDW: 13.1 % (ref 11.5–15.5)
WBC: 11.4 10*3/uL — ABNORMAL HIGH (ref 4.0–10.5)
nRBC: 0 % (ref 0.0–0.2)

## 2019-11-05 LAB — ECHOCARDIOGRAM COMPLETE
Height: 72 in
Weight: 4400 oz

## 2019-11-05 MED ORDER — ATORVASTATIN CALCIUM 20 MG PO TABS
40.0000 mg | ORAL_TABLET | Freq: Every day | ORAL | Status: DC
  Administered 2019-11-05 – 2019-11-06 (×2): 40 mg via ORAL
  Filled 2019-11-05 (×2): qty 2

## 2019-11-05 MED ORDER — PERFLUTREN LIPID MICROSPHERE
1.0000 mL | INTRAVENOUS | Status: AC | PRN
  Administered 2019-11-05: 2 mL via INTRAVENOUS
  Filled 2019-11-05: qty 10

## 2019-11-05 MED ORDER — ASPIRIN EC 81 MG PO TBEC
81.0000 mg | DELAYED_RELEASE_TABLET | Freq: Every day | ORAL | Status: DC
  Administered 2019-11-05 – 2019-11-06 (×2): 81 mg via ORAL
  Filled 2019-11-05 (×2): qty 1

## 2019-11-05 MED ORDER — ADULT MULTIVITAMIN W/MINERALS CH
1.0000 | ORAL_TABLET | Freq: Every day | ORAL | Status: DC
  Administered 2019-11-05 – 2019-11-06 (×2): 1 via ORAL
  Filled 2019-11-05 (×2): qty 1

## 2019-11-05 MED ORDER — ONE-DAILY MULTI VITAMINS PO TABS: 1.0000 | ORAL_TABLET | Freq: Every day | ORAL | Status: DC

## 2019-11-05 MED ORDER — LISINOPRIL 10 MG PO TABS
10.0000 mg | ORAL_TABLET | Freq: Every day | ORAL | Status: DC
  Administered 2019-11-05 – 2019-11-06 (×2): 10 mg via ORAL
  Filled 2019-11-05 (×2): qty 1

## 2019-11-05 MED ORDER — PANTOPRAZOLE SODIUM 40 MG PO TBEC
40.0000 mg | DELAYED_RELEASE_TABLET | Freq: Every day | ORAL | Status: DC
  Administered 2019-11-05 – 2019-11-06 (×2): 40 mg via ORAL
  Filled 2019-11-05 (×2): qty 1

## 2019-11-05 MED ORDER — TAMSULOSIN HCL 0.4 MG PO CAPS
0.4000 mg | ORAL_CAPSULE | Freq: Every day | ORAL | Status: DC
  Administered 2019-11-05 – 2019-11-06 (×2): 0.4 mg via ORAL
  Filled 2019-11-05 (×2): qty 1

## 2019-11-05 NOTE — Progress Notes (Signed)
SLP Cancellation Note  Patient Details Name: [name] MRN: [id number] DOB: Aug 04, 1945   Cancelled treatment:       Reason Eval/Treat Not Completed: SLP screened, no needs identified, will sign off  [name] M.S., CCC-SLP, [name] [phone]  [name] 11/05/2019, 1:32 PM

## 2019-11-05 NOTE — Plan of Care (Signed)

## 2019-11-05 NOTE — Progress Notes (Signed)
Inpatient Diabetes Program Recommendations  AACE/ADA: New Consensus Statement on Inpatient Glycemic Control (2015)  Target Ranges:  Prepandial:   less than 140 mg/dL      Peak postprandial:   less than 180 mg/dL (1-2 hours)      Critically ill patients:  140 - 180 mg/dL   Lab Results  Component Value Date   GLUCAP 220 (H) 11/05/2019   HGBA1C 7.0 (H) 04/20/2012    Review of Glycemic Control Results for GRAYSEN, WOODYARD (MRN [id number]) as of 11/05/2019 12:47  Ref. Range 11/05/2019 00:06 11/05/2019 08:14 11/05/2019 11:51  Glucose-Capillary Latest Ref Range: 70 - 99 mg/dL 241 (H) 157 (H) 220 (H)   Diabetes history: DM 2 Outpatient Diabetes medications: Metformin 500 mg bid Current orders for Inpatient glycemic control:  Novolog moderate tid with meals and HS  Inpatient Diabetes Program Recommendations:    Note post-prandial CBG increased.  A1C pending.  Consider adding Novolog meal coverage 3 units tid with meals (hold if patient eats less than 50%, NPO, or CBG<80 mg/dL).  Thanks,  Adah Perl, RN, BC-ADM Inpatient Diabetes Coordinator Pager [phone] (8a-5p)

## 2019-11-05 NOTE — Progress Notes (Signed)
*  PRELIMINARY RESULTS* Echocardiogram 2D Echocardiogram has been performed.  [name] 11/05/2019, 10:21 AM

## 2019-11-05 NOTE — Consult Note (Signed)
Requesting Physician: Posey Pronto    Chief Complaint: Diplopia  I have been asked by Dr. Posey Pronto to see this patient in consultation for acute infarct.  HPI: [name] is an 74 y.o. male with medical history significant of COPD, diabetes, hypertension, coronary artery disease, hyperlipidemia, obstructive sleep apnea and peripheral vascular disease who presents with acute onset of double vision.  Diplopia is binocular.  There are no other associated symptoms.  Initial NIHSS of 1.  Date last known well: 11/04/19 Time last known well: Time: 11:00 tPA Given: No: Outside time window  Past Medical History:  Diagnosis Date  . COPD (chronic obstructive pulmonary disease) ([place])   . Coronary artery disease   . Diabetes mellitus without complication ([place])   . Gastric ulcer with hemorrhage   . GERD (gastroesophageal reflux disease)   . Headache   . Hypercholesterolemia   . Peripheral vascular disease ([place])   . Prostate enlargement   . Skin cancer   . Sleep apnea     Past Surgical History:  Procedure Laterality Date  . CHOLECYSTECTOMY    . circumscision     from [employer] surgery for epididymitis  . COLONOSCOPY WITH PROPOFOL N/A 07/28/2015   Procedure: COLONOSCOPY WITH PROPOFOL;  Surgeon: Manya Silvas, MD;  Location[facility] ENDOSCOPY;  Service: Endoscopy;  Laterality: N/A;  . CORONARY ANGIOPLASTY  x2  . HERNIA REPAIR    . stomach ulcer surgery      Family History  Problem Relation Age of Onset  . Kidney cancer Neg Hx   . Bladder Cancer Neg Hx   . Prostate cancer Neg Hx    Social History:  reports that he has been smoking. He has never used smokeless tobacco. He reports current alcohol use. He reports that he does not use drugs.  Allergies:  Allergies  Allergen Reactions  . Iodine     Medications:  I have reviewed the patient's current medications. Prior to Admission:  Medications Prior to Admission  Medication Sig Dispense Refill Last Dose  . aspirin EC 81 MG tablet Take 81 mg by  mouth daily.   11/04/2019 at 0800  . aspirin-acetaminophen-caffeine (EXCEDRIN MIGRAINE) 250-250-65 MG tablet Take 1 tablet by mouth as needed.    PRN at PRN  . ibuprofen (ADVIL,MOTRIN) 200 MG tablet Take 200 mg by mouth daily.    11/04/2019 at 0800  . LACTOBACILLUS RHAMNOSUS, GG, PO Take 1 capsule by mouth daily.    11/04/2019 at 0800  . lisinopril (PRINIVIL,ZESTRIL) 10 MG tablet Take 10 mg by mouth daily.     11/04/2019 at 0800  . Multiple Vitamin (MULTI-VITAMINS) TABS Take 1 tablet by mouth daily.    11/04/2019 at 0800  . naproxen sodium (ALEVE) 220 MG tablet Take 220 mg by mouth daily.   11/04/2019 at 0800  . pantoprazole (PROTONIX) 40 MG tablet Take 40 mg by mouth daily.   11/04/2019 at 0800  . simvastatin (ZOCOR) 40 MG tablet Take 40 mg by mouth daily.   11/04/2019 at 0800  . tamsulosin (FLOMAX) 0.4 MG CAPS capsule Take 0.4 mg by mouth.   11/04/2019 at 0800  . triamcinolone (KENALOG) 0.025 % cream Place 1 application into the nose as needed.    PRN at PRN  . azelastine (ASTELIN) 0.1 % nasal spray 2 sprays by Each Nare route Two (2) times a day.     . Multiple Vitamin (MULTIVITAMIN) tablet Take 1 tablet by mouth daily.      Scheduled: . aspirin EC  81  mg Oral Daily  . atorvastatin  40 mg Oral Daily  . enoxaparin (LOVENOX) injection  40 mg Subcutaneous Q24H  . insulin aspart  0-15 Units Subcutaneous TID WC  . insulin aspart  0-5 Units Subcutaneous QHS  . lisinopril  10 mg Oral Daily  . multivitamin with minerals  1 tablet Oral Daily  . pantoprazole  40 mg Oral Daily  . sodium chloride flush  3 mL Intravenous Once  . tamsulosin  0.4 mg Oral Daily    ROS: History obtained from the patient  General ROS: negative for - chills, fatigue, fever, night sweats, weight gain or weight loss Psychological ROS: negative for - behavioral disorder, hallucinations, memory difficulties, mood swings or suicidal ideation Ophthalmic ROS: negative for - blurry vision, double vision, eye pain or loss of  vision ENT ROS: negative for - epistaxis, nasal discharge, oral lesions, sore throat, tinnitus or vertigo Allergy and Immunology ROS: negative for - hives or itchy/watery eyes Hematological and Lymphatic ROS: negative for - bleeding problems, bruising or swollen lymph nodes Endocrine ROS: negative for - galactorrhea, hair pattern changes, polydipsia/polyuria or temperature intolerance Respiratory ROS: negative for - cough, hemoptysis, shortness of breath or wheezing Cardiovascular ROS: negative for - chest pain, dyspnea on exertion, edema or irregular heartbeat Gastrointestinal ROS: negative for - abdominal pain, diarrhea, hematemesis, nausea/vomiting or stool incontinence Genito-Urinary ROS: negative for - dysuria, hematuria, incontinence or urinary frequency/urgency Musculoskeletal ROS: negative for - joint swelling or muscular weakness Neurological ROS: as noted in HPI Dermatological ROS: negative for rash and skin lesion changes  Physical Examination: Blood pressure 123/80, pulse 66, temperature 97.7 F (36.5 C), temperature source Oral, resp. rate 18, height 6' (1.829 m), weight 124.7 kg, SpO2 97 %.  HEENT-  Normocephalic, no lesions, without obvious abnormality.  Normal external eye and conjunctiva.  Normal TM's bilaterally.  Normal auditory canals and external ears. Normal external nose, mucus membranes and septum.  Normal pharynx. Cardiovascular- S1, S2 normal, pulses palpable throughout   Lungs- chest clear, no wheezing, rales, normal symmetric air entry Abdomen- soft, non-tender; bowel sounds normal; no masses,  no organomegaly Extremities- no edema Lymph-no adenopathy palpable Musculoskeletal-no joint tenderness, deformity or swelling Skin-warm and dry, no hyperpigmentation, vitiligo, or suspicious lesions  Neurological Examination   Mental Status: Alert, oriented, thought content appropriate.  Speech fluent without evidence of aphasia.  Able to follow 3 step commands without  difficulty. Cranial Nerves: II: Discs flat bilaterally; Visual fields grossly normal, pupils equal, round, reactive to light and accommodation III,IV, VI: decreased right adduction of the right eye V,VII: smile symmetric, facial light touch sensation normal bilaterally VIII: hearing normal bilaterally IX,X: gag reflex present XI: bilateral shoulder shrug XII: midline tongue extension Motor: Right : Upper extremity   5/5    Left:     Upper extremity   5/5  Lower extremity   5/5     Lower extremity   5/5 Tone and bulk:normal tone throughout; no atrophy noted Sensory: Pinprick and light touch intact throughout, bilaterally Deep Tendon Reflexes: Symmetric throughout Plantars: Right: mute   Left: mute Cerebellar: Normal finger-to-nose and normal heel-to-shin testing bilaterally Gait: not tested due to safety concerns    Laboratory Studies:  Basic Metabolic Panel: Recent Labs  Lab 11/04/19 1850 11/05/19 0430  NA 138 140  K 4.3 3.8  CL 101 103  CO2 25 28  GLUCOSE 205* 170*  BUN 12 11  CREATININE 0.73 0.68  CALCIUM 9.4 8.8*    Liver Function Tests: Recent  Labs  Lab 11/04/19 1850 11/05/19 0430  AST 35 24  ALT 38 33  ALKPHOS 65 50  BILITOT 1.2 0.9  PROT 7.8 6.3*  ALBUMIN 4.7 3.7   No results for input(s): LIPASE, AMYLASE in the last 168 hours. No results for input(s): AMMONIA in the last 168 hours.  CBC: Recent Labs  Lab 11/04/19 1850 11/05/19 0430  WBC 13.4* 11.4*  NEUTROABS 10.5*  --   HGB 17.2* 14.9  HCT 49.5 44.0  MCV 88.6 91.1  PLT 185 180    Cardiac Enzymes: No results for input(s): CKTOTAL, CKMB, CKMBINDEX, TROPONINI in the last 168 hours.  BNP: Invalid input(s): POCBNP  CBG: Recent Labs  Lab 11/05/19 0006 11/05/19 0814 11/05/19 1151  GLUCAP 241* 157* 220*    Microbiology: Results for orders placed or performed during the hospital encounter of 11/04/19  SARS Coronavirus 2 by RT PCR (hospital order, performed in [facility] hospital lab)  Nasopharyngeal Nasopharyngeal Swab     Status: None   Collection Time: 11/04/19  9:48 PM   Specimen: Nasopharyngeal Swab  Result Value Ref Range Status   SARS Coronavirus 2 NEGATIVE NEGATIVE Final    Comment: (NOTE) SARS-CoV-2 target nucleic acids are NOT DETECTED.  The SARS-CoV-2 RNA is generally detectable in upper and lower respiratory specimens during the acute phase of infection. The lowest concentration of SARS-CoV-2 viral copies this assay can detect is 250 copies / mL. A negative result does not preclude SARS-CoV-2 infection and should not be used as the sole basis for treatment or other patient management decisions.  A negative result may occur with improper specimen collection / handling, submission of specimen other than nasopharyngeal swab, presence of viral mutation(s) within the areas targeted by this assay, and inadequate number of viral copies (<250 copies / mL). A negative result must be combined with clinical observations, patient history, and epidemiological information.  Fact Sheet for Patients:   StrictlyIdeas.no  Fact Sheet for Healthcare Providers: BankingDealers.co.za  This test is not yet approved or  cleared by the [place] FDA and has been authorized for detection and/or diagnosis of SARS-CoV-2 by FDA under an Emergency Use Authorization (EUA).  This EUA will remain in effect (meaning this test can be used) for the duration of the COVID-19 declaration under Section 564(b)(1) of the Act, 21 U.S.C. section 360bbb-3(b)(1), unless the authorization is terminated or revoked sooner.  Performed at [facility], [address] [place], [place] [address]     Coagulation Studies: Recent Labs    11/04/19 1850  LABPROT 12.8  INR 1.0    Urinalysis: No results for input(s): COLORURINE, LABSPEC, PHURINE, GLUCOSEU, HGBUR, BILIRUBINUR, KETONESUR, PROTEINUR, UROBILINOGEN, NITRITE, LEUKOCYTESUR in the  last 168 hours.  Invalid input(s): APPERANCEUR  Lipid Panel:    Component Value Date/Time   CHOL 142 11/05/2019 0430   CHOL 107 04/21/2012 0351   TRIG 178 (H) 11/05/2019 0430   TRIG 211 (H) 04/21/2012 0351   HDL 30 (L) 11/05/2019 0430   HDL 18 (L) 04/21/2012 0351   CHOLHDL 4.7 11/05/2019 0430   VLDL 36 11/05/2019 0430   VLDL 42 (H) 04/21/2012 0351   LDLCALC 76 11/05/2019 0430   LDLCALC 47 04/21/2012 0351    HgbA1C:  Lab Results  Component Value Date   HGBA1C 7.0 (H) 04/20/2012    Urine Drug Screen:  No results found for: LABOPIA, COCAINSCRNUR, LABBENZ, AMPHETMU, THCU, LABBARB  Alcohol Level: No results for input(s): ETH in the last 168 hours.  Other results:  EKG: normal sinus rhythm at 87 bpm, RBBB.  Imaging: CT HEAD WO CONTRAST  Result Date: 11/04/2019 CLINICAL DATA:  Double vision EXAM: CT HEAD WITHOUT CONTRAST TECHNIQUE: Contiguous axial images were obtained from the base of the skull through the vertex without intravenous contrast. COMPARISON:  None. FINDINGS: Brain: No acute territorial infarction, hemorrhage, or intracranial mass. Mild atrophy. Mild hypodensity in the white matter likely chronic small vessel ischemic change. Slightly prominent ventricles felt secondary to atrophy. Vascular: No hyperdense vessels.  Carotid vascular calcification Skull: Normal. Negative for fracture or focal lesion. Sinuses/Orbits: No acute finding. Other: Punctate densities/possible small foreign bodies within the soft tissues superficial to the left lateral orbital wall. IMPRESSION: 1. No CT evidence for acute intracranial abnormality. 2. Atrophy and minimal chronic small vessel ischemic change of the white matter Electronically Signed   By: Donavan Foil M.D.   On: 11/04/2019 18:26   MR ANGIO HEAD WO CONTRAST  Result Date: 11/04/2019 CLINICAL DATA:  Double vision EXAM: MRI HEAD WITHOUT CONTRAST MRA HEAD WITHOUT CONTRAST TECHNIQUE: Multiplanar, multiecho pulse sequences of the brain and  surrounding structures were obtained without intravenous contrast. Angiographic images of the head were obtained using MRA technique without contrast. COMPARISON:  CT head 11/04/2019 FINDINGS: MRI HEAD FINDINGS Brain: Small focus of restricted diffusion in the right oculomotor nucleus anterior and to the right of the aqueduct. No other acute infarct. Scattered small white matter hyperintensities, mild. Negative for hemorrhage or mass. Ventricles are enlarged due to mild atrophy. Vascular: Normal arterial flow voids. Skull and upper cervical spine: Normal bone marrow. Sinuses/Orbits: Mild mucosal edema paranasal sinuses. Bilateral cataract extraction Other: None MRA HEAD FINDINGS Internal carotid artery widely patent bilaterally. Anterior and middle cerebral arteries widely patent bilaterally without stenosis or occlusion. Both vertebral arteries widely patent. PICA patent bilaterally. Basilar widely patent. Superior cerebellar and posterior cerebral arteries patent bilaterally without stenosis. Negative for cerebral aneurysm. 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 Mild chronic microvascular ischemia and mild atrophy Negative MRA head Electronically Signed   By: Franchot Gallo M.D.   On: 11/04/2019 21:08   MR BRAIN WO CONTRAST  Result Date: 11/04/2019 CLINICAL DATA:  Double vision EXAM: MRI HEAD WITHOUT CONTRAST MRA HEAD WITHOUT CONTRAST TECHNIQUE: Multiplanar, multiecho pulse sequences of the brain and surrounding structures were obtained without intravenous contrast. Angiographic images of the head were obtained using MRA technique without contrast. COMPARISON:  CT head 11/04/2019 FINDINGS: MRI HEAD FINDINGS Brain: Small focus of restricted diffusion in the right oculomotor nucleus anterior and to the right of the aqueduct. No other acute infarct. Scattered small white matter hyperintensities, mild. Negative for hemorrhage or mass. Ventricles are enlarged due to mild  atrophy. Vascular: Normal arterial flow voids. Skull and upper cervical spine: Normal bone marrow. Sinuses/Orbits: Mild mucosal edema paranasal sinuses. Bilateral cataract extraction Other: None MRA HEAD FINDINGS Internal carotid artery widely patent bilaterally. Anterior and middle cerebral arteries widely patent bilaterally without stenosis or occlusion. Both vertebral arteries widely patent. PICA patent bilaterally. Basilar widely patent. Superior cerebellar and posterior cerebral arteries patent bilaterally without stenosis. Negative for cerebral aneurysm. 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 Mild chronic microvascular ischemia and mild atrophy Negative MRA head Electronically Signed   By: Franchot Gallo M.D.   On: 11/04/2019 21:08   US Carotid Bilateral (at [facility] and [facility] only)  Result Date: 11/05/2019 CLINICAL DATA:  74 year old male with CVA EXAM: BILATERAL CAROTID DUPLEX ULTRASOUND TECHNIQUE: Pearline Cables scale imaging,  color Doppler and duplex ultrasound were performed of bilateral carotid and vertebral arteries in the neck. COMPARISON:  None. FINDINGS: Criteria: Quantification of carotid stenosis is based on velocity parameters that correlate the residual internal carotid diameter with NASCET-based stenosis levels, using the diameter of the distal internal carotid lumen as the denominator for stenosis measurement. The following velocity measurements were obtained: RIGHT ICA:  Systolic 93 cm/sec, Diastolic 29 cm/sec CCA:  85 cm/sec SYSTOLIC ICA/CCA RATIO:  1.1 ECA:  91 cm/sec LEFT ICA:  Systolic 829 cm/sec, Diastolic 37 cm/sec CCA:  562 cm/sec SYSTOLIC ICA/CCA RATIO:  1.2 ECA:  93 cm/sec Right Brachial SBP: Not acquired Left Brachial SBP: Not acquired RIGHT CAROTID ARTERY: No significant calcifications of the right common carotid artery. Intermediate waveform maintained. Heterogeneous and partially calcified plaque at the right carotid bifurcation. No significant lumen  shadowing. Low resistance waveform of the right ICA. No significant tortuosity. RIGHT VERTEBRAL ARTERY: Antegrade flow with low resistance waveform. LEFT CAROTID ARTERY: No significant calcifications of the left common carotid artery. Intermediate waveform maintained. Heterogeneous and partially calcified plaque at the left carotid bifurcation without significant lumen shadowing. Low resistance waveform of the left ICA. No significant tortuosity. LEFT VERTEBRAL ARTERY:  Antegrade flow with low resistance waveform. IMPRESSION: Color duplex indicates minimal heterogeneous and calcified plaque, with no hemodynamically significant stenosis by duplex criteria in the extracranial cerebrovascular circulation. Signed, Dulcy Fanny. Dellia Nims, RPVI [facility] Radiology Electronically Signed   By: Corrie Mckusick D.O.   On: 11/05/2019 13:21    Assessment: 74 y.o. male with medical history significant of COPD, diabetes, hypertension, coronary artery disease, hyperlipidemia, obstructive sleep apnea and peripheral vascular disease who presents with acute onset of double vision.  Initial NIHSS of 1.  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  Etiology likely small vessel disease.  Carotid dopplers show no evidence of hemodynamically significant stenosis.  Echocardiogram is pending.  A1c pending, LDL 76.  BP controlled.     Stroke Risk Factors - diabetes mellitus, hyperlipidemia, hypertension and smoking  Plan: 1. HgbA1c pending.  Target A1c<7.0 2. Aggressive lipid management with target LDL<70. 3. PT consult, OT consult, Speech consult 4. Echocardiogram pending 5. Prophylactic therapy-Dual antiplatelet therapy with ASA 81mg  and Plavix 75mg  for three weeks with change to Plavix 75mg  daily alone as monotherapy after that time. 6. Telemetry monitoring 7. Frequent neuro checks 8. Smoking cessation  counseling  Alexis Goodell, MD Neurology [phone] 11/05/2019, 3:03 PM

## 2019-11-05 NOTE — Progress Notes (Addendum)
[facility] NAME: [name]    MR#:  [id number]  DATE OF BIRTH:  1946-04-11  SUBJECTIVE:   Patient came in wi Complains with difficulty with balanceh double vision. Continues with the same. Denies any difficulty swallowing. Difficulty balance while walking REVIEW OF SYSTEMS:   Review of Systems  Constitutional: Positive for fever. Negative for chills and weight loss.  HENT: Negative for ear discharge, ear pain and nosebleeds.   Eyes: Positive for double vision. Negative for blurred vision, pain and discharge.  Respiratory: Negative for sputum production, shortness of breath, wheezing and stridor.   Cardiovascular: Negative for chest pain, palpitations, orthopnea and PND.  Gastrointestinal: Negative for abdominal pain, diarrhea, nausea and vomiting.  Genitourinary: Negative for frequency and urgency.  Musculoskeletal: Negative for back pain and joint pain.  Neurological: Positive for dizziness and weakness. Negative for sensory change, speech change and focal weakness.  Psychiatric/Behavioral: Negative for depression and hallucinations. The patient is not nervous/anxious.    Tolerating Diet: yes Tolerating PT: pending  DRUG ALLERGIES:   Allergies  Allergen Reactions  . Iodine     VITALS:  Blood pressure 123/80, pulse 66, temperature 97.7 F (36.5 C), temperature source Oral, resp. rate 18, height 6' (1.829 m), weight 124.7 kg, SpO2 97 %.  PHYSICAL EXAMINATION:   Physical Exam  GENERAL:  74 y.o.-year-old patient lying in the bed with no acute distress.  EYES: Pupils equal, round, reactive to light and accommodation. No scleral icterus.   HEENT: Head atraumatic, normocephalic. Oropharynx and nasopharynx clear.  NECK:  Supple, no jugular venous distention. No thyroid enlargement, no tenderness.  LUNGS: Normal breath sounds bilaterally, no wheezing, rales, rhonchi. No use of accessory muscles of respiration.   CARDIOVASCULAR: S1, S2 normal. No murmurs, rubs, or gallops.  ABDOMEN: Soft, nontender, nondistended. Bowel sounds present. No organomegaly or mass.  EXTREMITIES: No cyanosis, clubbing or edema b/l.    NEUROLOGIC: diplopia. No dysarthria. No focal Motor or sensory deficits b/l.   PSYCHIATRIC:  patient is alert and oriented x 3.  SKIN: No obvious rash, lesion, or ulcer.   LABORATORY PANEL:  CBC Recent Labs  Lab 11/05/19 0430  WBC 11.4*  HGB 14.9  HCT 44.0  PLT 180    Chemistries  Recent Labs  Lab 11/05/19 0430  NA 140  K 3.8  CL 103  CO2 28  GLUCOSE 170*  BUN 11  CREATININE 0.68  CALCIUM 8.8*  AST 24  ALT 33  ALKPHOS 50  BILITOT 0.9   Cardiac Enzymes No results for input(s): TROPONINI in the last 168 hours. RADIOLOGY:  CT HEAD WO CONTRAST  Result Date: 11/04/2019 CLINICAL DATA:  Double vision EXAM: CT HEAD WITHOUT CONTRAST TECHNIQUE: Contiguous axial images were obtained from the base of the skull through the vertex without intravenous contrast. COMPARISON:  None. FINDINGS: Brain: No acute territorial infarction, hemorrhage, or intracranial mass. Mild atrophy. Mild hypodensity in the white matter likely chronic small vessel ischemic change. Slightly prominent ventricles felt secondary to atrophy. Vascular: No hyperdense vessels.  Carotid vascular calcification Skull: Normal. Negative for fracture or focal lesion. Sinuses/Orbits: No acute finding. Other: Punctate densities/possible small foreign bodies within the soft tissues superficial to the left lateral orbital wall. IMPRESSION: 1. No CT evidence for acute intracranial abnormality. 2. Atrophy and minimal chronic small vessel ischemic change of the white matter Electronically Signed   By: Donavan Foil M.D.   On: 11/04/2019 18:26   MR ANGIO HEAD  WO CONTRAST  Result Date: 11/04/2019 CLINICAL DATA:  Double vision EXAM: MRI HEAD WITHOUT CONTRAST MRA HEAD WITHOUT CONTRAST TECHNIQUE: Multiplanar, multiecho pulse sequences of  the brain and surrounding structures were obtained without intravenous contrast. Angiographic images of the head were obtained using MRA technique without contrast. COMPARISON:  CT head 11/04/2019 FINDINGS: MRI HEAD FINDINGS Brain: Small focus of restricted diffusion in the right oculomotor nucleus anterior and to the right of the aqueduct. No other acute infarct. Scattered small white matter hyperintensities, mild. Negative for hemorrhage or mass. Ventricles are enlarged due to mild atrophy. Vascular: Normal arterial flow voids. Skull and upper cervical spine: Normal bone marrow. Sinuses/Orbits: Mild mucosal edema paranasal sinuses. Bilateral cataract extraction Other: None MRA HEAD FINDINGS Internal carotid artery widely patent bilaterally. Anterior and middle cerebral arteries widely patent bilaterally without stenosis or occlusion. Both vertebral arteries widely patent. PICA patent bilaterally. Basilar widely patent. Superior cerebellar and posterior cerebral arteries patent bilaterally without stenosis. Negative for cerebral aneurysm. 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 Mild chronic microvascular ischemia and mild atrophy Negative MRA head Electronically Signed   By: Franchot Gallo M.D.   On: 11/04/2019 21:08   MR BRAIN WO CONTRAST  Result Date: 11/04/2019 CLINICAL DATA:  Double vision EXAM: MRI HEAD WITHOUT CONTRAST MRA HEAD WITHOUT CONTRAST TECHNIQUE: Multiplanar, multiecho pulse sequences of the brain and surrounding structures were obtained without intravenous contrast. Angiographic images of the head were obtained using MRA technique without contrast. COMPARISON:  CT head 11/04/2019 FINDINGS: MRI HEAD FINDINGS Brain: Small focus of restricted diffusion in the right oculomotor nucleus anterior and to the right of the aqueduct. No other acute infarct. Scattered small white matter hyperintensities, mild. Negative for hemorrhage or mass. Ventricles are enlarged  due to mild atrophy. Vascular: Normal arterial flow voids. Skull and upper cervical spine: Normal bone marrow. Sinuses/Orbits: Mild mucosal edema paranasal sinuses. Bilateral cataract extraction Other: None MRA HEAD FINDINGS Internal carotid artery widely patent bilaterally. Anterior and middle cerebral arteries widely patent bilaterally without stenosis or occlusion. Both vertebral arteries widely patent. PICA patent bilaterally. Basilar widely patent. Superior cerebellar and posterior cerebral arteries patent bilaterally without stenosis. Negative for cerebral aneurysm. 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 Mild chronic microvascular ischemia and mild atrophy Negative MRA head Electronically Signed   By: Franchot Gallo M.D.   On: 11/04/2019 21:08   US Carotid Bilateral (at [facility] and [facility] only)  Result Date: 11/05/2019 CLINICAL DATA:  74 year old male with CVA EXAM: BILATERAL CAROTID DUPLEX ULTRASOUND TECHNIQUE: Pearline Cables scale imaging, color Doppler and duplex ultrasound were performed of bilateral carotid and vertebral arteries in the neck. COMPARISON:  None. FINDINGS: Criteria: Quantification of carotid stenosis is based on velocity parameters that correlate the residual internal carotid diameter with NASCET-based stenosis levels, using the diameter of the distal internal carotid lumen as the denominator for stenosis measurement. The following velocity measurements were obtained: RIGHT ICA:  Systolic 93 cm/sec, Diastolic 29 cm/sec CCA:  85 cm/sec SYSTOLIC ICA/CCA RATIO:  1.1 ECA:  91 cm/sec LEFT ICA:  Systolic 591 cm/sec, Diastolic 37 cm/sec CCA:  638 cm/sec SYSTOLIC ICA/CCA RATIO:  1.2 ECA:  93 cm/sec Right Brachial SBP: Not acquired Left Brachial SBP: Not acquired RIGHT CAROTID ARTERY: No significant calcifications of the right common carotid artery. Intermediate waveform maintained. Heterogeneous and partially calcified plaque at the right carotid bifurcation. No  significant lumen shadowing. Low resistance waveform of the right ICA. No significant tortuosity. RIGHT VERTEBRAL  ARTERY: Antegrade flow with low resistance waveform. LEFT CAROTID ARTERY: No significant calcifications of the left common carotid artery. Intermediate waveform maintained. Heterogeneous and partially calcified plaque at the left carotid bifurcation without significant lumen shadowing. Low resistance waveform of the left ICA. No significant tortuosity. LEFT VERTEBRAL ARTERY:  Antegrade flow with low resistance waveform. IMPRESSION: Color duplex indicates minimal heterogeneous and calcified plaque, with no hemodynamically significant stenosis by duplex criteria in the extracranial cerebrovascular circulation. Signed, Dulcy Fanny. Dellia Nims, RPVI [facility] Electronically Signed   By: Corrie Mckusick D.O.   On: 11/05/2019 13:21   ASSESSMENT AND PLAN:   DMARCUS [name] is a 74 y.o. male with medical history significant of COPD, diabetes, hypertension, coronary artery disease, hyperlipidemia, obstructive sleep apnea and peripheral vascular disease who presents with acute onset of double vision and blurred vision.  This happened around 11 AM in the morning.  Patient realized when he covers his right eye the vision is now normal.  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  - Patient still has diplopia.   -cont aspirin.   -Continue with high intensity statin.   -Get echocardiogram.  - Patient already has positive MRI.  - carotid Dopplers Color duplex indicates minimal heterogeneous and calcified plaque, with no hemodynamically significant stenosis by duplex criteria in the extracranial cerebrovascular circulation -neurology consultation placed for Dr. Doy Mince. Notified via secure chat. -PT OT -patient will benefit from eyepatch.  #2 hypertension: -continue lisinopril  #3 diabetes type II, poorly controlled with  vascular complication  sliding scale insulin patient reports not taking any meds for diabetes start patient on metformin 500 BID  #4 hyperlipidemia: Continue with statin  #5 history of tobacco abuse: Counseling provided.  Offered nicotine patch   DVT prophylaxis: Lovenox Code Status: Full code Family Communication: None at bedside Disposition Plan: Home Consults called: Neurology  Status is: Inpatient  Remains inpatient appropriate because:Inpatient level of care appropriate due to severity of illness   Dispo: The patient is from: Home              Anticipated d/c is to: TBD              Anticipated d/c date is: 2 days              Patient currently is not medically stable to d/c. patient admitted with stroke. Neurology consultation pending. PT OT evaluation pending. TOC for discharge planning       TOTAL TIME TAKING CARE OF THIS PATIENT: *35* minutes.  >50% time spent on counselling and coordination of care  Note: This dictation was prepared with Dragon dictation along with smaller phrase technology. Any transcriptional errors that result from this process are unintentional.  Fritzi Mandes M.D    [facility]   CC: Primary care physician; Raelene Bott, MDPatient ID: [name], male   DOB: 02-06-1946, 74 y.o.   MRN: [id number]

## 2019-11-05 NOTE — Evaluation (Signed)
Physical Therapy Evaluation Patient Details Name: [name] MRN: [id number] DOB: July 28, 1945 Today's Date: 11/05/2019   History of Present Illness  Pt. is a 74 y.o. male who was admitted to [facility] with acute onset of double, and blurred vision. Imaging revealed Small Acute Infarct in the Right Oculomotor Nucleus causing Diplopia, mild hronic Ischemia, and mild atrophy. PMMHx includes: COPD, DM, HTN, CAD, Hyperlipidemia, Obstructive Sleep Apnea, and PVD  Clinical Impression  Pt is a pleasant 74 year old male who was admitted for CVA in R oculomotor nucleus. Main complaint is diplopia. Pt performs bed mobility with independence, transfers with supervision, and ambulation with cga and no AD. Pt demonstrates deficits with balance/vision deficits. All coordination/sensation/strength intact in B UE/LE. Able to track finger and demonstrate good eye movements. Trialed taping technique over glasses with improvement in deficits. Would benefit from skilled PT to address above deficits and promote optimal return to PLOF. Currently recommending eye patch over R eye and alternating each day in addition to OP PT for eye strengthening and to address balance impairments.    Follow Up Recommendations Outpatient PT    Equipment Recommendations  None recommended by PT    Recommendations for Other Services       Precautions / Restrictions Precautions Precautions: None Restrictions Weight Bearing Restrictions: No      Mobility  Bed Mobility Overal bed mobility: Independent Bed Mobility: Supine to Sit     Supine to sit: Independent     General bed mobility comments: safe technique with upright posture and ease of mobility. No dizziness noted  Transfers Overall transfer level: Needs assistance Equipment used: None Transfers: Sit to/from Stand Sit to Stand: Supervision         General transfer comment: safe technique with upright posture. No AD used  Ambulation/Gait Ambulation/Gait  assistance: Min Conservator, museum/gallery (Feet): 200 Feet Assistive device: None Gait Pattern/deviations: Step-through pattern     General Gait Details: ambulated around RN station without AD. Further gait details in ther-ex section  Stairs            Wheelchair Mobility    Modified Rankin (Stroke Patients Only)       Balance Overall balance assessment: Independent Sitting-balance support: No upper extremity supported Sitting balance-Leahy Scale: Good       Standing balance-Leahy Scale: Fair                               Pertinent Vitals/Pain Pain Assessment: No/denies pain    Home Living Family/patient expects to be discharged to:: Private residence Living Arrangements: Alone Available Help at Discharge: Family Type of Home: House Home Access: Stairs to enter Entrance Stairs-Rails: Right Entrance Stairs-Number of Steps: 3 Home Layout: One level Home Equipment: Walker - 2 wheels;Crutches;Hand held shower head;Shower seat      Prior Function Level of Independence: Independent         Comments: Pt. was independent with ADLs, IADLs, driving, and medication management.     Hand Dominance   Dominant Hand: Right    Extremity/Trunk Assessment   Upper Extremity Assessment Upper Extremity Assessment: Overall WFL for tasks assessed    Lower Extremity Assessment Lower Extremity Assessment: Overall WFL for tasks assessed       Communication   Communication: No difficulties  Cognition Arousal/Alertness: Awake/alert Behavior During Therapy: WFL for tasks assessed/performed Overall Cognitive Status: Within Functional Limits for tasks assessed  General Comments      Exercises Other Exercises Other Exercises: ambulation performed in hallway with reciprocal gait pattern including head turns and gait speed changes. Needed cga for unsteadiness. Slightly decreased step length on L side. Cues  for keeping head looking forward instead of down at floor. Other Exercises: taped glasses on R side to occlude vision and improve symptoms   Assessment/Plan    PT Assessment Patient needs continued PT services  PT Problem List Decreased balance       PT Treatment Interventions DME instruction;Gait training;Stair training;Balance training    PT Goals (Current goals can be found in the Care Plan section)  Acute Rehab PT Goals Patient Stated Goal: To regain vision PT Goal Formulation: With patient Time For Goal Achievement: 11/19/19 Potential to Achieve Goals: Good    Frequency 7X/week   Barriers to discharge        Co-evaluation               AM-PAC PT "6 Clicks" Mobility  Outcome Measure Help needed turning from your back to your side while in a flat bed without using bedrails?: None Help needed moving from lying on your back to sitting on the side of a flat bed without using bedrails?: None Help needed moving to and from a bed to a chair (including a wheelchair)?: None Help needed standing up from a chair using your arms (e.g., wheelchair or bedside chair)?: None Help needed to walk in hospital room?: A Little Help needed climbing 3-5 steps with a railing? : A Little 6 Click Score: 22    End of Session Equipment Utilized During Treatment: Gait belt Activity Tolerance: Patient tolerated treatment well Patient left: in chair Nurse Communication: Mobility status PT Visit Diagnosis: Unsteadiness on feet (R26.81)    Time: 7353-2992 PT Time Calculation (min) (ACUTE ONLY): 24 min   Charges:   PT Evaluation $PT Eval Moderate Complexity: 1 Mod PT Treatments $Gait Training: 8-22 mins        Greggory Stallion, PT, DPT [phone]   [name] 11/05/2019, 3:29 PM

## 2019-11-05 NOTE — Evaluation (Signed)
Occupational Therapy Evaluation Patient Details Name: [name] MRN: [id number] DOB: 09/11/1945 Today's Date: 11/05/2019    History of Present Illness Pt. is a 74 y.o. male who was admitted to [facility] with acute onset of double, and blurred vision. Imaging revealed Small Acute Infarct in the Right Oculomotor Nucleus causing Diplopia, mild hronic Ischemia, and mild atrophy. PMMHx includes: COPD, DM, HTN, CAD, Hyperlipidemia, Obstructive Sleep Apnea, and PVD   Clinical Impression   Pt. Presents with vsion changes with Diplopia and blurriness, weakness, limited activity tolerance, limited functional mobility which limits the ability to complete basic ADL and IADL functioning.  Pt. Resides at home alone. Pt. Was independent with ADLs, and IADL functioning: including meal preparation, and medication management. Pt. Was able to drive. Pt. Has supportive family. Pt. Reports that his home has a lot of items in it from hoarding, and years of collecting. Pt. Reports that he will need to have pathways cleared of stuff in his home prior to receiving therapy in his home. Pt. will benefit from OT skilled services for ADL training, A/E training, neuromuscular re-ed, and pt. Education about visual compensatory strategies, home modification, and DME. Pt. Will benefit from CIR level of care to improve overall ADL, and IADL functioning and maximize independence needed to return to independent living.     Follow Up Recommendations  CIR    Equipment Recommendations  3 in 1 bedside commode    Recommendations for Other Services Rehab consult     Precautions / Restrictions Precautions Precautions: None Restrictions Weight Bearing Restrictions: No      Mobility Bed Mobility Overal bed mobility: Needs Assistance;Independent Bed Mobility: Supine to Sit;Sit to Supine              Transfers Overall transfer level: Needs assistance   Transfers: Sit to/from Stand Sit to Stand: Min guard;Min assist          General transfer comment: Pt. reports feeling as though his legs are giving away out from under him when sidestepping towards the head of the bed.    Balance Overall balance assessment: Needs assistance Sitting-balance support: No upper extremity supported Sitting balance-Leahy Scale: Good       Standing balance-Leahy Scale: Fair                             ADL either performed or assessed with clinical judgement   ADL Overall ADL's : Independent;Needs assistance/impaired Eating/Feeding: Set up;Independent   Grooming: Set up;Sitting;Min guard   Upper Body Bathing: Set up;Min guard   Lower Body Bathing: Set up;Minimal assistance   Upper Body Dressing : Set up   Lower Body Dressing: Set up;Minimal assistance;Sitting/lateral leans               Functional mobility during ADLs: Moderate assistance;Minimal assistance       Vision Baseline Vision/History: Wears glasses Wears Glasses: Reading only Patient Visual Report: Diplopia;Blurring of vision Vision Assessment?: Vision impaired- to be further tested in functional context     Perception     Praxis      Pertinent Vitals/Pain Pain Assessment: No/denies pain     Hand Dominance Right   Extremity/Trunk Assessment Upper Extremity Assessment Upper Extremity Assessment: Overall WFL for tasks assessed           Communication Communication Communication: No difficulties   Cognition Arousal/Alertness: Awake/alert Behavior During Therapy: WFL for tasks assessed/performed Overall Cognitive Status: Within Functional Limits for tasks assessed  General Comments       Exercises     Shoulder Instructions      Home Living Family/patient expects to be discharged to:: Private residence Living Arrangements: Alone Available Help at Discharge: Family Type of Home: House Home Access: Stairs to enter Technical brewer of Steps: 3 Entrance  Stairs-Rails: Right Home Layout: One level     Bathroom Shower/Tub: Vander: Environmental consultant - 2 wheels;Crutches;Hand held shower head;Shower seat          Prior Functioning/Environment Level of Independence: Independent        Comments: Pt. was independent with ADLs, IADLs, driving, and medication management.        OT Problem List: Decreased strength;Decreased range of motion;Decreased activity tolerance;Decreased knowledge of use of DME or AE;Impaired vision/perception      OT Treatment/Interventions: Self-care/ADL training;Patient/family education;Therapeutic exercise;DME and/or AE instruction;Therapeutic activities;Neuromuscular education    OT Goals(Current goals can be found in the care plan section) Acute Rehab OT Goals Patient Stated Goal: To regain vision OT Goal Formulation: With patient Potential to Achieve Goals: Good  OT Frequency: Min 3X/week   Barriers to D/C:            Co-evaluation              AM-PAC OT "6 Clicks" Daily Activity     Outcome Measure Help from another person eating meals?: None Help from another person taking care of personal grooming?: A Little Help from another person toileting, which includes using toliet, bedpan, or urinal?: A Little Help from another person bathing (including washing, rinsing, drying)?: A Little Help from another person to put on and taking off regular upper body clothing?: A Little Help from another person to put on and taking off regular lower body clothing?: A Little 6 Click Score: 19   End of Session Equipment Utilized During Treatment: Gait belt  Activity Tolerance: Patient tolerated treatment well Patient left: in bed;with call bell/phone within reach;with chair alarm set;with family/visitor present  OT Visit Diagnosis: Unsteadiness on feet (R26.81);Muscle weakness (generalized) (M62.81);Low vision, both eyes (H54.2)                Time: 1326-1400 OT Time Calculation  (min): 34 min Charges:  OT General Charges $OT Visit: 1 Visit OT Evaluation $OT Eval Moderate Complexity: 1 Mod  Harrel Carina, MS, OTR/L   Harrel Carina 11/05/2019, 2:26 PM

## 2019-11-06 LAB — GLUCOSE, CAPILLARY
Glucose-Capillary: 154 mg/dL — ABNORMAL HIGH (ref 70–99)
Glucose-Capillary: 214 mg/dL — ABNORMAL HIGH (ref 70–99)

## 2019-11-06 MED ORDER — CLOPIDOGREL BISULFATE 75 MG PO TABS
75.0000 mg | ORAL_TABLET | Freq: Every day | ORAL | Status: DC
  Administered 2019-11-06: 13:00:00 75 mg via ORAL
  Filled 2019-11-06: qty 1

## 2019-11-06 MED ORDER — CLOPIDOGREL BISULFATE 75 MG PO TABS: 75.0000 mg | ORAL_TABLET | Freq: Every day | ORAL | 0 refills | Status: DC

## 2019-11-06 NOTE — TOC Initial Note (Signed)
Transition of Care [facility]) - Initial/Assessment Note    Patient Details  Name: [name] MRN: [id number] Date of Birth: [birth year]  Transition of Care [facility] CM/SW Contact:    Shelbie Hutching, RN Phone Number: 11/06/2019, 1:18 PM  Clinical Narrative:                 Patient admitted to the hospital for acute CVA.  Patient has been medically cleared for discharge home.  PT recommends outpatient PT and OT.  Patient agrees to OP PT and OT here at [facility], referral faxed to [place].  Patient lives alone and is independent at home.  Patient reports that he just got his license renewed but he can get a friend to take him to appointments.  Patient is current with his PCP.  He has no DME needs.    Expected Discharge Plan: OP Rehab Barriers to Discharge: No Barriers Identified   Patient Goals and CMS Choice   CMS Medicare.gov Compare Post Acute Care list provided to:: Patient Choice offered to / list presented to : Patient  Expected Discharge Plan and Services Expected Discharge Plan: OP Rehab   Discharge Planning Services: CM Consult Post Acute Care Choice:  (outpatient rehab) Living arrangements for the past 2 months: Single Family Home Expected Discharge Date: 11/06/19                                    Prior Living Arrangements/Services Living arrangements for the past 2 months: Single Family Home Lives with:: Self Patient language and need for interpreter reviewed:: Yes Do you feel safe going back to the place where you live?: Yes      Need for Family Participation in Patient Care: No (Comment) Care giver support system in place?: No (comment) Current home services: DME (walker, crutches, bedside commode) Criminal Activity/Legal Involvement Pertinent to Current Situation/Hospitalization: No - Comment as needed  Activities of Daily Living Home Assistive Devices/Equipment: None ADL Screening (condition at time of admission) Patient's cognitive ability adequate to  safely complete daily activities?: Yes Is the patient deaf or have difficulty hearing?: No Does the patient have difficulty seeing, even when wearing glasses/contacts?: No Does the patient have difficulty concentrating, remembering, or making decisions?: No Patient able to express need for assistance with ADLs?: No Does the patient have difficulty dressing or bathing?: No Independently performs ADLs?: Yes (appropriate for developmental age) Does the patient have difficulty walking or climbing stairs?: Yes Weakness of Legs: Both Weakness of Arms/Hands: None  Permission Sought/Granted Permission sought to share information with : Case Manager, Other (comment) Permission granted to share information with : Yes, Verbal Permission Granted     Permission granted to share info w AGENCY: [facility] OP Rehab Department        Emotional Assessment Appearance:: Appears stated age Attitude/Demeanor/Rapport: Engaged Affect (typically observed): Accepting Orientation: : Oriented to Self, Oriented to Place, Oriented to  Time, Oriented to Situation Alcohol / Substance Use: Not Applicable Psych Involvement: No (comment)  Admission diagnosis:  Diplopia [H53.2] CVA (cerebral vascular accident) ([place]) [I63.9] Acute cerebrovascular accident (CVA) ([place]) [I63.9] Cerebrovascular accident (CVA), unspecified mechanism ([place]) [I63.9] Acute CVA (cerebrovascular accident) [facility]) [I63.9] Patient Active Problem List   Diagnosis Date Noted  . Acute CVA (cerebrovascular accident) ([place]) 11/05/2019  . Acute cerebrovascular accident (CVA) ([place]) 11/04/2019  . DM (diabetes mellitus) ([place]) 11/04/2019  . Hyperlipidemia 11/04/2019  . TOBACCO USER 02/20/2009  .  HYPERTENSION, BENIGN ESSENTIAL 12/02/2007  . PULMONARY EMBOLISM 12/02/2007  . WOUND, LEG, WITH COMPLICATION 87/18/3672   PCP:  Raelene Bott, MD Pharmacy:   [id number] [place] [address] Phone: [phone] Fax: [phone]  [id number], [address] [place] [address] Phone: [phone] Fax: [facility] [place], [place] [address] Phone: [phone] Fax: [phone]     Social Determinants of Health (SDOH) Interventions    Readmission Risk Interventions No flowsheet data found.

## 2019-11-06 NOTE — Discharge Summary (Signed)
[name]:[id number] DOB: 19-Oct-1945 DOA: 11/04/2019  PCP: Raelene Bott, MD  Admit date: 11/04/2019 Discharge date: 11/06/2019  Admitted From: Home Disposition: Home  Recommendations for Outpatient Follow-up:  1. Follow up with PCP in 1 week 2. Please obtain BMP/CBC in one week 3. Neurology Dr. Manuella Ghazi in 2 week  Home Health: PT OT   Discharge Condition:Stable CODE STATUS:full  Diet recommendation: Heart Healthy  Brief/Interim Summary: [name] is a 74 y.o. male with medical history significant of COPD, diabetes, hypertension, coronary artery disease, hyperlipidemia, obstructive sleep apnea and peripheral vascular disease who presents with acute onset of double vision and blurred vision.  CT head without contrast was negative but MRI of the brain showed focal infarct involving the right oculomotor nucleus.    Neurology was consulted.  Recommended Prophylactic therapy-Dual antiplatelet therapy with ASA 81mg  and Plavix 75mg  for three weeks with change to Plavix 75mg  daily alone as monotherapy after that time.  Goal LDL < 70.His HA1c was 7.0, but goal is <7. PT/OT was consulted. Speech therapy was consulted during his hospitalization. Today he is stable to go home with home health , and cleared by neurology for discharge  Discharge Diagnoses:  Principal Problem:   Acute cerebrovascular accident (CVA) ([place]) Active Problems:   TOBACCO USER   HYPERTENSION, BENIGN ESSENTIAL   DM (diabetes mellitus) ([place])   Hyperlipidemia   Acute CVA (cerebrovascular accident) [facility])    Discharge Instructions  Discharge Instructions    Call MD for:  difficulty breathing, headache or visual disturbances   Complete by: As directed    Call MD for:  persistant nausea and vomiting   Complete by: As directed    Diet - low sodium heart healthy   Complete by: As directed    Discharge instructions   Complete by: As directed    Take aspirin and plavix for 3 weeks, then stop asprin and just take  plavix.  Follow up with your primary care in one week F/u with neurology in 2 weeks DO NOT TAKE ANY NSAIDS< IBUPROFEN ETC. As it will increase your risk of bleed   Increase activity slowly   Complete by: As directed      Allergies as of 11/06/2019      Reactions   Iodine       Medication List    STOP taking these medications   aspirin-acetaminophen-caffeine 250-250-65 MG tablet Commonly known as: EXCEDRIN MIGRAINE   ibuprofen 200 MG tablet Commonly known as: ADVIL   naproxen sodium 220 MG tablet Commonly known as: ALEVE     TAKE these medications   aspirin EC 81 MG tablet Take 81 mg by mouth daily.   azelastine 0.1 % nasal spray Commonly known as: ASTELIN 2 sprays by Each Nare route Two (2) times a day.   clopidogrel 75 MG tablet Commonly known as: PLAVIX Take 1 tablet (75 mg total) by mouth daily.   LACTOBACILLUS RHAMNOSUS (GG) PO Take 1 capsule by mouth daily.   lisinopril 10 MG tablet Commonly known as: ZESTRIL Take 10 mg by mouth daily.   multivitamin tablet Take 1 tablet by mouth daily.   Multi-Vitamins Tabs Take 1 tablet by mouth daily.   pantoprazole 40 MG tablet Commonly known as: PROTONIX Take 40 mg by mouth daily.   simvastatin 40 MG tablet Commonly known as: ZOCOR Take 40 mg by mouth daily.   tamsulosin 0.4 MG Caps capsule Commonly known as: FLOMAX Take 0.4 mg by mouth.   triamcinolone 0.025 %  cream Commonly known as: KENALOG Place 1 application into the nose as needed.       Follow-up Information    Vladimir Crofts, MD Follow up in 2 week(s).   Specialty: Neurology Contact information: [facility] [place] [address] [phone]        Raelene Bott, MD Follow up in 1 week(s).   Specialty: Internal Medicine Contact information: [address] [place] [address] [phone]              Allergies  Allergen Reactions  . Iodine      Consultations:  Neurology   Procedures/Studies: CT HEAD WO CONTRAST  Result Date: 11/04/2019 CLINICAL DATA:  Double vision EXAM: CT HEAD WITHOUT CONTRAST TECHNIQUE: Contiguous axial images were obtained from the base of the skull through the vertex without intravenous contrast. COMPARISON:  None. FINDINGS: Brain: No acute territorial infarction, hemorrhage, or intracranial mass. Mild atrophy. Mild hypodensity in the white matter likely chronic small vessel ischemic change. Slightly prominent ventricles felt secondary to atrophy. Vascular: No hyperdense vessels.  Carotid vascular calcification Skull: Normal. Negative for fracture or focal lesion. Sinuses/Orbits: No acute finding. Other: Punctate densities/possible small foreign bodies within the soft tissues superficial to the left lateral orbital wall. IMPRESSION: 1. No CT evidence for acute intracranial abnormality. 2. Atrophy and minimal chronic small vessel ischemic change of the white matter Electronically Signed   By: Donavan Foil M.D.   On: 11/04/2019 18:26   MR ANGIO HEAD WO CONTRAST  Result Date: 11/04/2019 CLINICAL DATA:  Double vision EXAM: MRI HEAD WITHOUT CONTRAST MRA HEAD WITHOUT CONTRAST TECHNIQUE: Multiplanar, multiecho pulse sequences of the brain and surrounding structures were obtained without intravenous contrast. Angiographic images of the head were obtained using MRA technique without contrast. COMPARISON:  CT head 11/04/2019 FINDINGS: MRI HEAD FINDINGS Brain: Small focus of restricted diffusion in the right oculomotor nucleus anterior and to the right of the aqueduct. No other acute infarct. Scattered small white matter hyperintensities, mild. Negative for hemorrhage or mass. Ventricles are enlarged due to mild atrophy. Vascular: Normal arterial flow voids. Skull and upper cervical spine: Normal bone marrow. Sinuses/Orbits: Mild mucosal edema paranasal sinuses. Bilateral cataract extraction Other: None MRA HEAD FINDINGS  Internal carotid artery widely patent bilaterally. Anterior and middle cerebral arteries widely patent bilaterally without stenosis or occlusion. Both vertebral arteries widely patent. PICA patent bilaterally. Basilar widely patent. Superior cerebellar and posterior cerebral arteries patent bilaterally without stenosis. Negative for cerebral aneurysm. 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 Mild chronic microvascular ischemia and mild atrophy Negative MRA head Electronically Signed   By: Franchot Gallo M.D.   On: 11/04/2019 21:08   MR BRAIN WO CONTRAST  Result Date: 11/04/2019 CLINICAL DATA:  Double vision EXAM: MRI HEAD WITHOUT CONTRAST MRA HEAD WITHOUT CONTRAST TECHNIQUE: Multiplanar, multiecho pulse sequences of the brain and surrounding structures were obtained without intravenous contrast. Angiographic images of the head were obtained using MRA technique without contrast. COMPARISON:  CT head 11/04/2019 FINDINGS: MRI HEAD FINDINGS Brain: Small focus of restricted diffusion in the right oculomotor nucleus anterior and to the right of the aqueduct. No other acute infarct. Scattered small white matter hyperintensities, mild. Negative for hemorrhage or mass. Ventricles are enlarged due to mild atrophy. Vascular: Normal arterial flow voids. Skull and upper cervical spine: Normal bone marrow. Sinuses/Orbits: Mild mucosal edema paranasal sinuses. Bilateral cataract extraction Other: None MRA HEAD FINDINGS Internal carotid artery widely patent  bilaterally. Anterior and middle cerebral arteries widely patent bilaterally without stenosis or occlusion. Both vertebral arteries widely patent. PICA patent bilaterally. Basilar widely patent. Superior cerebellar and posterior cerebral arteries patent bilaterally without stenosis. Negative for cerebral aneurysm. 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 Mild chronic microvascular ischemia and  mild atrophy Negative MRA head Electronically Signed   By: Franchot Gallo M.D.   On: 11/04/2019 21:08   US Carotid Bilateral (at [facility] and [facility] only)  Result Date: 11/05/2019 CLINICAL DATA:  74 year old male with CVA EXAM: BILATERAL CAROTID DUPLEX ULTRASOUND TECHNIQUE: Pearline Cables scale imaging, color Doppler and duplex ultrasound were performed of bilateral carotid and vertebral arteries in the neck. COMPARISON:  None. FINDINGS: Criteria: Quantification of carotid stenosis is based on velocity parameters that correlate the residual internal carotid diameter with NASCET-based stenosis levels, using the diameter of the distal internal carotid lumen as the denominator for stenosis measurement. The following velocity measurements were obtained: RIGHT ICA:  Systolic 93 cm/sec, Diastolic 29 cm/sec CCA:  85 cm/sec SYSTOLIC ICA/CCA RATIO:  1.1 ECA:  91 cm/sec LEFT ICA:  Systolic 035 cm/sec, Diastolic 37 cm/sec CCA:  009 cm/sec SYSTOLIC ICA/CCA RATIO:  1.2 ECA:  93 cm/sec Right Brachial SBP: Not acquired Left Brachial SBP: Not acquired RIGHT CAROTID ARTERY: No significant calcifications of the right common carotid artery. Intermediate waveform maintained. Heterogeneous and partially calcified plaque at the right carotid bifurcation. No significant lumen shadowing. Low resistance waveform of the right ICA. No significant tortuosity. RIGHT VERTEBRAL ARTERY: Antegrade flow with low resistance waveform. LEFT CAROTID ARTERY: No significant calcifications of the left common carotid artery. Intermediate waveform maintained. Heterogeneous and partially calcified plaque at the left carotid bifurcation without significant lumen shadowing. Low resistance waveform of the left ICA. No significant tortuosity. LEFT VERTEBRAL ARTERY:  Antegrade flow with low resistance waveform. IMPRESSION: Color duplex indicates minimal heterogeneous and calcified plaque, with no hemodynamically significant stenosis by duplex criteria in the extracranial  cerebrovascular circulation. Signed, Dulcy Fanny. Dellia Nims, RPVI [facility] Radiology Electronically Signed   By: Corrie Mckusick D.O.   On: 11/05/2019 13:21   ECHOCARDIOGRAM COMPLETE  Result Date: 11/05/2019    ECHOCARDIOGRAM REPORT   Patient Name:   [name] Date of Exam: 11/05/2019 Medical Rec #:  [id number]     Height:       72.0 in Accession #:    [id number]    Weight:       275.0 lb Date of Birth:  [birth year]     BSA:          2.439 m Patient Age:    37 years      BP:           111/62 mmHg Patient Gender: M             HR:           72 bpm. Exam Location:  [facility] Procedure: 2D Echo, Color Doppler, Cardiac Doppler and Intracardiac            Opacification Agent Indications:     I163.9 Stroke  History:         Patient has no prior history of Echocardiogram examinations.                  CAD, PVD and COPD; Risk Factors:Sleep Apnea, HCL, Diabetes and                  Current Smoker.  Sonographer:     Charmayne Sheer RDCS (AE) Referring Phys:  [place] Diagnosing Phys: Yolonda Kida MD  Sonographer Comments: Technically difficult study due to poor echo windows. Image acquisition challenging due to COPD and Image acquisition challenging due to patient body habitus. IMPRESSIONS  1. Left ventricular ejection fraction, by estimation, is 50 to 55%. The left ventricle has normal function. The left ventricle has no regional wall motion abnormalities. Left ventricular diastolic parameters were normal.  2. Right ventricular systolic function is low normal. The right ventricular size is mildly enlarged.  3. The mitral valve is normal in structure. No evidence of mitral valve regurgitation.  4. The aortic valve is normal in structure. Aortic valve regurgitation is not visualized. FINDINGS  Left Ventricle: Left ventricular ejection fraction, by estimation, is 50 to 55%. The left ventricle has normal function. The left ventricle has no regional wall motion  abnormalities. Definity contrast agent was given IV to delineate the left ventricular  endocardial borders. The left ventricular internal cavity size was normal in size. There is no left ventricular hypertrophy. Left ventricular diastolic parameters were normal. Right Ventricle: The right ventricular size is mildly enlarged. No increase in right ventricular wall thickness. Right ventricular systolic function is low normal. Left Atrium: Left atrial size was normal in size. Right Atrium: Right atrial size was normal in size. Pericardium: There is no evidence of pericardial effusion. Mitral Valve: The mitral valve is normal in structure. No evidence of mitral valve regurgitation. MV peak gradient, 2.9 mmHg. The mean mitral valve gradient is 1.0 mmHg. Tricuspid Valve: The tricuspid valve is normal in structure. Tricuspid valve regurgitation is not demonstrated. Aortic Valve: The aortic valve is normal in structure. Aortic valve regurgitation is not visualized. Aortic valve mean gradient measures 3.0 mmHg. Aortic valve peak gradient measures 6.4 mmHg. Aortic valve area, by VTI measures 4.51 cm. Pulmonic Valve: The pulmonic valve was grossly normal. Pulmonic valve regurgitation is not visualized. Aorta: The aortic root is normal in size and structure. IAS/Shunts: No atrial level shunt detected by color flow Doppler.  LEFT VENTRICLE PLAX 2D LVIDd:         5.21 cm      Diastology LVIDs:         3.83 cm      LV e' lateral:   10.70 cm/s LV PW:         0.96 cm      LV E/e' lateral: 7.4 LV IVS:        1.09 cm      LV e' medial:    7.94 cm/s LVOT diam:     2.50 cm      LV E/e' medial:  10.0 LV SV:         118 LV SV Index:   48 LVOT Area:     4.91 cm  LV Volumes (MOD) LV vol d, MOD A2C: 69.9 ml LV vol d, MOD A4C: 118.0 ml LV vol s, MOD A2C: 31.5 ml LV vol s, MOD A4C: 40.6 ml LV SV MOD A2C:     38.4 ml LV SV MOD A4C:     118.0 ml LV SV MOD BP:      57.0 ml RIGHT VENTRICLE RV Basal diam:  3.66 cm LEFT ATRIUM           Index LA  diam:      3.90 cm 1.60 cm/m LA Vol (A4C): 71.5 ml 29.31 ml/m  AORTIC VALVE  PULMONIC VALVE AV Area (Vmax):    4.36 cm    PV Vmax:       0.86 m/s AV Area (Vmean):   3.98 cm    PV Vmean:      59.800 cm/s AV Area (VTI):     4.51 cm    PV VTI:        0.140 m AV Vmax:           126.00 cm/s PV Peak grad:  3.0 mmHg AV Vmean:          86.700 cm/s PV Mean grad:  2.0 mmHg AV VTI:            0.261 m AV Peak Grad:      6.4 mmHg AV Mean Grad:      3.0 mmHg LVOT Vmax:         112.00 cm/s LVOT Vmean:        70.300 cm/s LVOT VTI:          0.240 m LVOT/AV VTI ratio: 0.92  AORTA Ao Root diam: 4.20 cm MITRAL VALVE MV Area (PHT): 3.27 cm    SHUNTS MV Peak grad:  2.9 mmHg    Systemic VTI:  0.24 m MV Mean grad:  1.0 mmHg    Systemic Diam: 2.50 cm MV Vmax:       0.85 m/s MV Vmean:      54.4 cm/s MV Decel Time: 232 msec MV E velocity: 79.50 cm/s MV A velocity: 51.40 cm/s MV E/A ratio:  1.55 Dwayne D Callwood MD Electronically signed by Yolonda Kida MD Signature Date/Time: 11/05/2019/4:32:46 PM    Final        Subjective: Has no complaints.  Tolerating his feeding.  Alert oriented x3.  Denies dizziness, lightheadedness.  His eyes have improved overall with mild diplopia.  Discharge Exam: Vitals:   11/06/19 0352 11/06/19 0756  BP: (!) 112/50 124/86  Pulse: (!) 57 62  Resp: 18 20  Temp: 98.4 F (36.9 C) 98.4 F (36.9 C)  SpO2: 95% 98%   Vitals:   11/05/19 1928 11/05/19 2318 11/06/19 0352 11/06/19 0756  BP: 115/70 114/81 (!) 112/50 124/86  Pulse: 64 60 (!) 57 62  Resp: 17 17 18 20   Temp: 98.4 F (36.9 C) 98.3 F (36.8 C) 98.4 F (36.9 C) 98.4 F (36.9 C)  TempSrc: Oral Oral Oral Oral  SpO2: 97% 95% 95% 98%  Weight:      Height:        General: Pt is alert, awake, not in acute distress alert oriented x3 Cardiovascular: RRR, S1/S2 +, no rubs, no gallops Respiratory: CTA bilaterally, no wheezing, no rhonchi Abdominal: Soft, NT, ND, bowel sounds + Extremities: no edema, no  cyanosis    The results of significant diagnostics from this hospitalization (including imaging, microbiology, ancillary and laboratory) are listed below for reference.     Microbiology: Recent Results (from the past 240 hour(s))  SARS Coronavirus 2 by RT PCR (hospital order, performed in [facility] hospital lab) Nasopharyngeal Nasopharyngeal Swab     Status: None   Collection Time: 11/04/19  9:48 PM   Specimen: Nasopharyngeal Swab  Result Value Ref Range Status   SARS Coronavirus 2 NEGATIVE NEGATIVE Final    Comment: (NOTE) SARS-CoV-2 target nucleic acids are NOT DETECTED.  The SARS-CoV-2 RNA is generally detectable in upper and lower respiratory specimens during the acute phase of infection. The lowest concentration of SARS-CoV-2 viral copies this assay can detect is 250 copies /  mL. A negative result does not preclude SARS-CoV-2 infection and should not be used as the sole basis for treatment or other patient management decisions.  A negative result may occur with improper specimen collection / handling, submission of specimen other than nasopharyngeal swab, presence of viral mutation(s) within the areas targeted by this assay, and inadequate number of viral copies (<250 copies / mL). A negative result must be combined with clinical observations, patient history, and epidemiological information.  Fact Sheet for Patients:   StrictlyIdeas.no  Fact Sheet for Healthcare Providers: BankingDealers.co.za  This test is not yet approved or  cleared by the [place] FDA and has been authorized for detection and/or diagnosis of SARS-CoV-2 by FDA under an Emergency Use Authorization (EUA).  This EUA will remain in effect (meaning this test can be used) for the duration of the COVID-19 declaration under Section 564(b)(1) of the Act, 21 U.S.C. section 360bbb-3(b)(1), unless the authorization is terminated or revoked sooner.  Performed  at [facility], [address] [place], [place] [address]      Labs: BNP (last 3 results) No results for input(s): BNP in the last 8760 hours. Basic Metabolic Panel: Recent Labs  Lab 11/04/19 1850 11/05/19 0430  NA 138 140  K 4.3 3.8  CL 101 103  CO2 25 28  GLUCOSE 205* 170*  BUN 12 11  CREATININE 0.73 0.68  CALCIUM 9.4 8.8*   Liver Function Tests: Recent Labs  Lab 11/04/19 1850 11/05/19 0430  AST 35 24  ALT 38 33  ALKPHOS 65 50  BILITOT 1.2 0.9  PROT 7.8 6.3*  ALBUMIN 4.7 3.7   No results for input(s): LIPASE, AMYLASE in the last 168 hours. No results for input(s): AMMONIA in the last 168 hours. CBC: Recent Labs  Lab 11/04/19 1850 11/05/19 0430  WBC 13.4* 11.4*  NEUTROABS 10.5*  --   HGB 17.2* 14.9  HCT 49.5 44.0  MCV 88.6 91.1  PLT 185 180   Cardiac Enzymes: No results for input(s): CKTOTAL, CKMB, CKMBINDEX, TROPONINI in the last 168 hours. BNP: Invalid input(s): POCBNP CBG: Recent Labs  Lab 11/05/19 0814 11/05/19 1151 11/05/19 1628 11/05/19 2105 11/06/19 0754  GLUCAP 157* 220* 233* 152* 154*   D-Dimer No results for input(s): DDIMER in the last 72 hours. Hgb A1c No results for input(s): HGBA1C in the last 72 hours. Lipid Profile Recent Labs    11/05/19 0430  CHOL 142  HDL 30*  LDLCALC 76  TRIG 178*  CHOLHDL 4.7   Thyroid function studies No results for input(s): TSH, T4TOTAL, T3FREE, THYROIDAB in the last 72 hours.  Invalid input(s): FREET3 Anemia work up No results for input(s): VITAMINB12, FOLATE, FERRITIN, TIBC, IRON, RETICCTPCT in the last 72 hours. Urinalysis    Component Value Date/Time   COLORURINE Amber 04/20/2012 0518   COLORURINE YELLOW 10/03/2007 0953   APPEARANCEUR Turbid 04/20/2012 0518   LABSPEC >1.060 04/20/2012 0518   PHURINE 5.0 04/20/2012 0518   PHURINE 5.5 10/03/2007 0953   GLUCOSEU 50 mg/dL 04/20/2012 0518   HGBUR 2+ 04/20/2012 0518   HGBUR NEGATIVE 10/03/2007 0953   BILIRUBINUR Negative  04/20/2012 0518   KETONESUR Negative 04/20/2012 0518   KETONESUR NEGATIVE 10/03/2007 0953   PROTEINUR 100 mg/dL 04/20/2012 0518   PROTEINUR NEGATIVE 10/03/2007 0953   UROBILINOGEN 0.2 10/03/2007 0953   NITRITE Negative 04/20/2012 0518   NITRITE NEGATIVE 10/03/2007 0953   LEUKOCYTESUR Negative 04/20/2012 0518   Sepsis Labs Invalid input(s): PROCALCITONIN,  WBC,  LACTICIDVEN Microbiology Recent  Results (from the past 240 hour(s))  SARS Coronavirus 2 by RT PCR (hospital order, performed in [name] hospital lab) Nasopharyngeal Nasopharyngeal Swab     Status: None   Collection Time: 11/04/19  9:48 PM   Specimen: Nasopharyngeal Swab  Result Value Ref Range Status   SARS Coronavirus 2 NEGATIVE NEGATIVE Final    Comment: (NOTE) SARS-CoV-2 target nucleic acids are NOT DETECTED.  The SARS-CoV-2 RNA is generally detectable in upper and lower respiratory specimens during the acute phase of infection. The lowest concentration of SARS-CoV-2 viral copies this assay can detect is 250 copies / mL. A negative result does not preclude SARS-CoV-2 infection and should not be used as the sole basis for treatment or other patient management decisions.  A negative result may occur with improper specimen collection / handling, submission of specimen other than nasopharyngeal swab, presence of viral mutation(s) within the areas targeted by this assay, and inadequate number of viral copies (<250 copies / mL). A negative result must be combined with clinical observations, patient history, and epidemiological information.  Fact Sheet for Patients:   StrictlyIdeas.no  Fact Sheet for Healthcare Providers: BankingDealers.co.za  This test is not yet approved or  cleared by the [place] FDA and has been authorized for detection and/or diagnosis of SARS-CoV-2 by FDA under an Emergency Use Authorization (EUA).  This EUA will remain in effect (meaning this  test can be used) for the duration of the COVID-19 declaration under Section 564(b)(1) of the Act, 21 U.S.C. section 360bbb-3(b)(1), unless the authorization is terminated or revoked sooner.  Performed at [facility], [address], [place], [place] [address]      Time coordinating discharge: Over 30 minutes  SIGNED:   Nolberto Hanlon, MD  [facility] 11/06/2019, 11:55 AM Pager   If 7PM-7AM, please contact night-coverage [email or web address] Password [email or web address]

## 2019-11-06 NOTE — Progress Notes (Addendum)
Subjective: No new neurological complaints  Objective: Current vital signs: BP 124/86 (BP Location: Right Arm)   Pulse 62   Temp 98.4 F (36.9 C) (Oral)   Resp 20   Ht 6' (1.829 m)   Wt 124.7 kg   SpO2 98%   BMI 37.30 kg/m  Vital signs in last 24 hours: Temp:  [97.7 F (36.5 C)-98.4 F (36.9 C)] 98.4 F (36.9 C) (06/24 0756) Pulse Rate:  [57-66] 62 (06/24 0756) Resp:  [17-20] 20 (06/24 0756) BP: (112-129)/(50-86) 124/86 (06/24 0756) SpO2:  [95 %-98 %] 98 % (06/24 0756)  Intake/Output from previous day: 06/23 0701 - 06/24 0700 In: 720 [P.O.:720] Out: 1325 [Urine:1325] Intake/Output this shift: Total I/O In: 240 [P.O.:240] Out: 300 [Urine:300] Nutritional status:  Diet Order            Diet heart healthy/carb modified Room service appropriate? Yes; Fluid consistency: Thin  Diet effective now                 Neurologic Exam: Mental Status: Alert, oriented, thought content appropriate.  Speech fluent without evidence of aphasia.  Able to follow 3 step commands without difficulty. Cranial Nerves: II: Discs flat bilaterally; Visual fields grossly normal, pupils equal, round, reactive to light and accommodation III,IV, VI: decreased right adduction of the right eye V,VII: smile symmetric, facial light touch sensation normal bilaterally VIII: hearing normal bilaterally IX,X: gag reflex present XI: bilateral shoulder shrug XII: midline tongue extension Motor: 5/5 throughout Sensory: Pinprick and light touch intact throughout, bilaterally   Lab Results: Basic Metabolic Panel: Recent Labs  Lab 11/04/19 1850 11/05/19 0430  NA 138 140  K 4.3 3.8  CL 101 103  CO2 25 28  GLUCOSE 205* 170*  BUN 12 11  CREATININE 0.73 0.68  CALCIUM 9.4 8.8*    Liver Function Tests: Recent Labs  Lab 11/04/19 1850 11/05/19 0430  AST 35 24  ALT 38 33  ALKPHOS 65 50  BILITOT 1.2 0.9  PROT 7.8 6.3*  ALBUMIN 4.7 3.7   No results for input(s): LIPASE, AMYLASE in the last  168 hours. No results for input(s): AMMONIA in the last 168 hours.  CBC: Recent Labs  Lab 11/04/19 1850 11/05/19 0430  WBC 13.4* 11.4*  NEUTROABS 10.5*  --   HGB 17.2* 14.9  HCT 49.5 44.0  MCV 88.6 91.1  PLT 185 180    Cardiac Enzymes: No results for input(s): CKTOTAL, CKMB, CKMBINDEX, TROPONINI in the last 168 hours.  Lipid Panel: Recent Labs  Lab 11/05/19 0430  CHOL 142  TRIG 178*  HDL 30*  CHOLHDL 4.7  VLDL 36  LDLCALC 76    CBG: Recent Labs  Lab 11/05/19 0814 11/05/19 1151 11/05/19 1628 11/05/19 2105 11/06/19 0754  GLUCAP 157* 220* 233* 152* 154*    Microbiology: Results for orders placed or performed during the hospital encounter of 11/04/19  SARS Coronavirus 2 by RT PCR (hospital order, performed in [facility] hospital lab) Nasopharyngeal Nasopharyngeal Swab     Status: None   Collection Time: 11/04/19  9:48 PM   Specimen: Nasopharyngeal Swab  Result Value Ref Range Status   SARS Coronavirus 2 NEGATIVE NEGATIVE Final    Comment: (NOTE) SARS-CoV-2 target nucleic acids are NOT DETECTED.  The SARS-CoV-2 RNA is generally detectable in upper and lower respiratory specimens during the acute phase of infection. The lowest concentration of SARS-CoV-2 viral copies this assay can detect is 250 copies / mL. A negative result does not preclude SARS-CoV-2 infection  and should not be used as the sole basis for treatment or other patient management decisions.  A negative result may occur with improper specimen collection / handling, submission of specimen other than nasopharyngeal swab, presence of viral mutation(s) within the areas targeted by this assay, and inadequate number of viral copies (<250 copies / mL). A negative result must be combined with clinical observations, patient history, and epidemiological information.  Fact Sheet for Patients:   StrictlyIdeas.no  Fact Sheet for Healthcare  Providers: BankingDealers.co.za  This test is not yet approved or  cleared by the [place] FDA and has been authorized for detection and/or diagnosis of SARS-CoV-2 by FDA under an Emergency Use Authorization (EUA).  This EUA will remain in effect (meaning this test can be used) for the duration of the COVID-19 declaration under Section 564(b)(1) of the Act, 21 U.S.C. section 360bbb-3(b)(1), unless the authorization is terminated or revoked sooner.  Performed at [facility], [address] [place], [place] [address]     Coagulation Studies: Recent Labs    11/04/19 1850  LABPROT 12.8  INR 1.0    Imaging: CT HEAD WO CONTRAST  Result Date: 11/04/2019 CLINICAL DATA:  Double vision EXAM: CT HEAD WITHOUT CONTRAST TECHNIQUE: Contiguous axial images were obtained from the base of the skull through the vertex without intravenous contrast. COMPARISON:  None. FINDINGS: Brain: No acute territorial infarction, hemorrhage, or intracranial mass. Mild atrophy. Mild hypodensity in the white matter likely chronic small vessel ischemic change. Slightly prominent ventricles felt secondary to atrophy. Vascular: No hyperdense vessels.  Carotid vascular calcification Skull: Normal. Negative for fracture or focal lesion. Sinuses/Orbits: No acute finding. Other: Punctate densities/possible small foreign bodies within the soft tissues superficial to the left lateral orbital wall. IMPRESSION: 1. No CT evidence for acute intracranial abnormality. 2. Atrophy and minimal chronic small vessel ischemic change of the white matter Electronically Signed   By: Donavan Foil M.D.   On: 11/04/2019 18:26   MR ANGIO HEAD WO CONTRAST  Result Date: 11/04/2019 CLINICAL DATA:  Double vision EXAM: MRI HEAD WITHOUT CONTRAST MRA HEAD WITHOUT CONTRAST TECHNIQUE: Multiplanar, multiecho pulse sequences of the brain and surrounding structures were obtained without intravenous contrast. Angiographic  images of the head were obtained using MRA technique without contrast. COMPARISON:  CT head 11/04/2019 FINDINGS: MRI HEAD FINDINGS Brain: Small focus of restricted diffusion in the right oculomotor nucleus anterior and to the right of the aqueduct. No other acute infarct. Scattered small white matter hyperintensities, mild. Negative for hemorrhage or mass. Ventricles are enlarged due to mild atrophy. Vascular: Normal arterial flow voids. Skull and upper cervical spine: Normal bone marrow. Sinuses/Orbits: Mild mucosal edema paranasal sinuses. Bilateral cataract extraction Other: None MRA HEAD FINDINGS Internal carotid artery widely patent bilaterally. Anterior and middle cerebral arteries widely patent bilaterally without stenosis or occlusion. Both vertebral arteries widely patent. PICA patent bilaterally. Basilar widely patent. Superior cerebellar and posterior cerebral arteries patent bilaterally without stenosis. Negative for cerebral aneurysm. 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 Mild chronic microvascular ischemia and mild atrophy Negative MRA head Electronically Signed   By: Franchot Gallo M.D.   On: 11/04/2019 21:08   MR BRAIN WO CONTRAST  Result Date: 11/04/2019 CLINICAL DATA:  Double vision EXAM: MRI HEAD WITHOUT CONTRAST MRA HEAD WITHOUT CONTRAST TECHNIQUE: Multiplanar, multiecho pulse sequences of the brain and surrounding structures were obtained without intravenous contrast. Angiographic images of the head were obtained using MRA technique without contrast. COMPARISON:  CT  head 11/04/2019 FINDINGS: MRI HEAD FINDINGS Brain: Small focus of restricted diffusion in the right oculomotor nucleus anterior and to the right of the aqueduct. No other acute infarct. Scattered small white matter hyperintensities, mild. Negative for hemorrhage or mass. Ventricles are enlarged due to mild atrophy. Vascular: Normal arterial flow voids. Skull and upper cervical spine:  Normal bone marrow. Sinuses/Orbits: Mild mucosal edema paranasal sinuses. Bilateral cataract extraction Other: None MRA HEAD FINDINGS Internal carotid artery widely patent bilaterally. Anterior and middle cerebral arteries widely patent bilaterally without stenosis or occlusion. Both vertebral arteries widely patent. PICA patent bilaterally. Basilar widely patent. Superior cerebellar and posterior cerebral arteries patent bilaterally without stenosis. Negative for cerebral aneurysm. 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 Mild chronic microvascular ischemia and mild atrophy Negative MRA head Electronically Signed   By: Franchot Gallo M.D.   On: 11/04/2019 21:08   US Carotid Bilateral (at [facility] and [facility] only)  Result Date: 11/05/2019 CLINICAL DATA:  74 year old male with CVA EXAM: BILATERAL CAROTID DUPLEX ULTRASOUND TECHNIQUE: Pearline Cables scale imaging, color Doppler and duplex ultrasound were performed of bilateral carotid and vertebral arteries in the neck. COMPARISON:  None. FINDINGS: Criteria: Quantification of carotid stenosis is based on velocity parameters that correlate the residual internal carotid diameter with NASCET-based stenosis levels, using the diameter of the distal internal carotid lumen as the denominator for stenosis measurement. The following velocity measurements were obtained: RIGHT ICA:  Systolic 93 cm/sec, Diastolic 29 cm/sec CCA:  85 cm/sec SYSTOLIC ICA/CCA RATIO:  1.1 ECA:  91 cm/sec LEFT ICA:  Systolic 720 cm/sec, Diastolic 37 cm/sec CCA:  947 cm/sec SYSTOLIC ICA/CCA RATIO:  1.2 ECA:  93 cm/sec Right Brachial SBP: Not acquired Left Brachial SBP: Not acquired RIGHT CAROTID ARTERY: No significant calcifications of the right common carotid artery. Intermediate waveform maintained. Heterogeneous and partially calcified plaque at the right carotid bifurcation. No significant lumen shadowing. Low resistance waveform of the right ICA. No significant tortuosity.  RIGHT VERTEBRAL ARTERY: Antegrade flow with low resistance waveform. LEFT CAROTID ARTERY: No significant calcifications of the left common carotid artery. Intermediate waveform maintained. Heterogeneous and partially calcified plaque at the left carotid bifurcation without significant lumen shadowing. Low resistance waveform of the left ICA. No significant tortuosity. LEFT VERTEBRAL ARTERY:  Antegrade flow with low resistance waveform. IMPRESSION: Color duplex indicates minimal heterogeneous and calcified plaque, with no hemodynamically significant stenosis by duplex criteria in the extracranial cerebrovascular circulation. Signed, Dulcy Fanny. Dellia Nims, RPVI [facility] Electronically Signed   By: Corrie Mckusick D.O.   On: 11/05/2019 13:21   ECHOCARDIOGRAM COMPLETE  Result Date: 11/05/2019    ECHOCARDIOGRAM REPORT   Patient Name:   [name] Date of Exam: 11/05/2019 Medical Rec #:  [id number]     Height:       72.0 in Accession #:    [id number]    Weight:       275.0 lb Date of Birth:  [birth year]     BSA:          2.439 m Patient Age:    74 years      BP:           111/62 mmHg Patient Gender: M             HR:           72 bpm. Exam Location:  [facility] Procedure: 2D Echo, Color Doppler, Cardiac Doppler and Intracardiac  Opacification Agent Indications:     I163.9 Stroke  History:         Patient has no prior history of Echocardiogram examinations.                  CAD, PVD and COPD; Risk Factors:Sleep Apnea, HCL, Diabetes and                  Current Smoker.  Sonographer:     Charmayne Sheer RDCS (AE) Referring Phys:  [place] Diagnosing Phys: Yolonda Kida MD  Sonographer Comments: Technically difficult study due to poor echo windows. Image acquisition challenging due to COPD and Image acquisition challenging due to patient body habitus. IMPRESSIONS  1. Left ventricular ejection fraction, by estimation, is 50 to 55%. The left ventricle  has normal function. The left ventricle has no regional wall motion abnormalities. Left ventricular diastolic parameters were normal.  2. Right ventricular systolic function is low normal. The right ventricular size is mildly enlarged.  3. The mitral valve is normal in structure. No evidence of mitral valve regurgitation.  4. The aortic valve is normal in structure. Aortic valve regurgitation is not visualized. FINDINGS  Left Ventricle: Left ventricular ejection fraction, by estimation, is 50 to 55%. The left ventricle has normal function. The left ventricle has no regional wall motion abnormalities. Definity contrast agent was given IV to delineate the left ventricular  endocardial borders. The left ventricular internal cavity size was normal in size. There is no left ventricular hypertrophy. Left ventricular diastolic parameters were normal. Right Ventricle: The right ventricular size is mildly enlarged. No increase in right ventricular wall thickness. Right ventricular systolic function is low normal. Left Atrium: Left atrial size was normal in size. Right Atrium: Right atrial size was normal in size. Pericardium: There is no evidence of pericardial effusion. Mitral Valve: The mitral valve is normal in structure. No evidence of mitral valve regurgitation. MV peak gradient, 2.9 mmHg. The mean mitral valve gradient is 1.0 mmHg. Tricuspid Valve: The tricuspid valve is normal in structure. Tricuspid valve regurgitation is not demonstrated. Aortic Valve: The aortic valve is normal in structure. Aortic valve regurgitation is not visualized. Aortic valve mean gradient measures 3.0 mmHg. Aortic valve peak gradient measures 6.4 mmHg. Aortic valve area, by VTI measures 4.51 cm. Pulmonic Valve: The pulmonic valve was grossly normal. Pulmonic valve regurgitation is not visualized. Aorta: The aortic root is normal in size and structure. IAS/Shunts: No atrial level shunt detected by color flow Doppler.  LEFT VENTRICLE PLAX 2D  LVIDd:         5.21 cm      Diastology LVIDs:         3.83 cm      LV e' lateral:   10.70 cm/s LV PW:         0.96 cm      LV E/e' lateral: 7.4 LV IVS:        1.09 cm      LV e' medial:    7.94 cm/s LVOT diam:     2.50 cm      LV E/e' medial:  10.0 LV SV:         118 LV SV Index:   48 LVOT Area:     4.91 cm  LV Volumes (MOD) LV vol d, MOD A2C: 69.9 ml LV vol d, MOD A4C: 118.0 ml LV vol s, MOD A2C: 31.5 ml LV vol s, MOD A4C: 40.6 ml LV SV MOD  A2C:     38.4 ml LV SV MOD A4C:     118.0 ml LV SV MOD BP:      57.0 ml RIGHT VENTRICLE RV Basal diam:  3.66 cm LEFT ATRIUM           Index LA diam:      3.90 cm 1.60 cm/m LA Vol (A4C): 71.5 ml 29.31 ml/m  AORTIC VALVE                   PULMONIC VALVE AV Area (Vmax):    4.36 cm    PV Vmax:       0.86 m/s AV Area (Vmean):   3.98 cm    PV Vmean:      59.800 cm/s AV Area (VTI):     4.51 cm    PV VTI:        0.140 m AV Vmax:           126.00 cm/s PV Peak grad:  3.0 mmHg AV Vmean:          86.700 cm/s PV Mean grad:  2.0 mmHg AV VTI:            0.261 m AV Peak Grad:      6.4 mmHg AV Mean Grad:      3.0 mmHg LVOT Vmax:         112.00 cm/s LVOT Vmean:        70.300 cm/s LVOT VTI:          0.240 m LVOT/AV VTI ratio: 0.92  AORTA Ao Root diam: 4.20 cm MITRAL VALVE MV Area (PHT): 3.27 cm    SHUNTS MV Peak grad:  2.9 mmHg    Systemic VTI:  0.24 m MV Mean grad:  1.0 mmHg    Systemic Diam: 2.50 cm MV Vmax:       0.85 m/s MV Vmean:      54.4 cm/s MV Decel Time: 232 msec MV E velocity: 79.50 cm/s MV A velocity: 51.40 cm/s MV E/A ratio:  1.55 Dwayne D Callwood MD Electronically signed by Yolonda Kida MD Signature Date/Time: 11/05/2019/4:32:46 PM    Final     Medications:  I have reviewed the patient's current medications. Scheduled: . aspirin EC  81 mg Oral Daily  . atorvastatin  40 mg Oral Daily  . enoxaparin (LOVENOX) injection  40 mg Subcutaneous Q24H  . insulin aspart  0-15 Units Subcutaneous TID WC  . insulin aspart  0-5 Units Subcutaneous QHS  . lisinopril  10 mg Oral  Daily  . multivitamin with minerals  1 tablet Oral Daily  . pantoprazole  40 mg Oral Daily  . sodium chloride flush  3 mL Intravenous Once  . tamsulosin  0.4 mg Oral Daily    Assessment/Plan: 74 y.o. male with medical history significant ofCOPD, diabetes, hypertension, coronary artery disease, hyperlipidemia, obstructive sleep apnea and peripheral vascular disease who presents with acute onset of double vision.  Initial NIHSS of 1.  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  Carotid dopplers show no evidence of hemodynamically significant stenosis.  Echocardiogram shows no cardiac source of emboli with an EF of 50-55%.  A1c pending, LDL 76.  BP controlled.     Stroke Risk Factors - diabetes mellitus, hyperlipidemia, hypertension and smoking  Plan: 1. HgbA1c pending.  Target A1c<7.0 2. Aggressive lipid management with target LDL<70. 3. PT consult, OT consult, Speech consult 4. Prophylactic therapy-Dual  antiplatelet therapy with ASA 81mg  and Plavix 75mg  for three weeks with change to Plavix 75mg  daily alone as monotherapy after that time. 5. Smoking cessation counseling  No further neurologic intervention is recommended at this time.  If further questions arise, please call or page at that time.  Thank you for allowing neurology to participate in the care of this patient.  Patient to follow up with neurology and ophthalmology on an outpatient basis.   LOS: 1 day   Alexis Goodell, MD Neurology [phone] 11/06/2019  11:24 AM

## 2019-11-07 LAB — HEMOGLOBIN A1C
Hgb A1c MFr Bld: 7.9 % — ABNORMAL HIGH (ref 4.8–5.6)
Hgb A1c MFr Bld: 7.8 % — ABNORMAL HIGH (ref 4.8–5.6)
Mean Plasma Glucose: 180 mg/dL
Mean Plasma Glucose: 177 mg/dL

## 2021-02-25 DIAGNOSIS — Z23 Encounter for immunization: Secondary | ICD-10-CM | POA: Diagnosis not present

## 2021-09-30 ENCOUNTER — Other Ambulatory Visit: Payer: Self-pay | Admitting: Family Medicine

## 2021-09-30 ENCOUNTER — Ambulatory Visit (INDEPENDENT_AMBULATORY_CARE_PROVIDER_SITE_OTHER): Payer: No Typology Code available for payment source | Admitting: Family Medicine

## 2021-09-30 ENCOUNTER — Encounter: Payer: Self-pay | Admitting: Family Medicine

## 2021-09-30 VITALS — BP 108/64 | HR 76 | Ht 72.0 in | Wt 215.6 lb

## 2021-09-30 DIAGNOSIS — N401 Enlarged prostate with lower urinary tract symptoms: Secondary | ICD-10-CM | POA: Insufficient documentation

## 2021-09-30 DIAGNOSIS — Z86711 Personal history of pulmonary embolism: Secondary | ICD-10-CM

## 2021-09-30 DIAGNOSIS — I739 Peripheral vascular disease, unspecified: Secondary | ICD-10-CM | POA: Diagnosis not present

## 2021-09-30 DIAGNOSIS — R350 Frequency of micturition: Secondary | ICD-10-CM

## 2021-09-30 DIAGNOSIS — I10 Essential (primary) hypertension: Secondary | ICD-10-CM

## 2021-09-30 DIAGNOSIS — E1159 Type 2 diabetes mellitus with other circulatory complications: Secondary | ICD-10-CM

## 2021-09-30 DIAGNOSIS — E1169 Type 2 diabetes mellitus with other specified complication: Secondary | ICD-10-CM

## 2021-09-30 DIAGNOSIS — E785 Hyperlipidemia, unspecified: Secondary | ICD-10-CM

## 2021-09-30 DIAGNOSIS — Z0000 Encounter for general adult medical examination without abnormal findings: Secondary | ICD-10-CM

## 2021-09-30 NOTE — Patient Instructions (Addendum)
Thank you for coming to the office today.  Keep on [employer] OTC prostate  If needed in the future, if the Insurance will allow [place] - we can refer to:  [place] Address: [address], [place], [place] [address] Phone: [phone]  ---------------------------------------------------  VASCULAR SURGERY  [facility], [place], [place] [address]  Main: [phone]  DUE for FASTING BLOOD WORK (no food or drink after midnight before the lab appointment, only water or coffee without cream/sugar on the morning of)  SCHEDULE "Lab Only" visit in the morning at the clinic for lab draw in 2 MONTHS   - Make sure Lab Only appointment is at about 1 week before your next appointment, so that results will be available  For Lab Results, once available within 2-3 days of blood draw, you can can log in to MyChart online to view your results and a brief explanation. Also, we can discuss results at next follow-up visit.    Please schedule a Follow-up Appointment to: Return in about 2 months (around 11/30/2021) for 2 month fasting lab only then 1 week later Annual Physical.  If you have any other questions or concerns, please feel free to call the office or send a message through [place]. You may also schedule an earlier appointment if necessary.  Additionally, you may be receiving a survey about your experience at our office within a few days to 1 week by e-mail or mail. We value your feedback.  Nobie Putnam, DO [place]

## 2021-09-30 NOTE — Progress Notes (Signed)
Subjective:    Patient ID: [name], male    DOB: 03/12/1946, 76 y.o.   MRN: [id number]  [name] is a 76 y.o. male presenting on 09/30/2021 for Establish Care   HPI  Previously in [place] [facility] [place]. Here to establish.  CHRONIC DM, Type 2: Reports controlled w/ lifestyle CBGs: Not checking regularly Meds: None. Currently on ACEi Denies hypoglycemia, polyuria, visual changes, numbness or tingling.  BPH, w LUTS He wears adult diaper Uses OTC Saw Palmetto TID, limited relief Previous record he took Tamsulosin but did not help. Previously DC'd from [facility]  HYPERLIPIDEMIA: - Reports no concerns. 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  Forgetful for names / places / words        View : No data to display.          Past Medical History:  Diagnosis Date   COPD (chronic obstructive pulmonary disease) ([name])    Coronary artery disease    Diabetes mellitus without complication ([name])    Gastric ulcer with hemorrhage    GERD (gastroesophageal reflux disease)    Headache    Hypercholesterolemia    Peripheral vascular disease (HCC)    Prostate enlargement    Skin cancer    Sleep apnea    Past Surgical History:  Procedure Laterality Date   CHOLECYSTECTOMY     circumscision     from [employer] [facility] for epididymitis   COLONOSCOPY WITH PROPOFOL N/A 07/28/2015   Procedure: COLONOSCOPY WITH PROPOFOL;  Surgeon: Manya Silvas, MD;  Location: [facility];  Service: Endoscopy;  Laterality: N/A;   CORONARY ANGIOPLASTY  x2   HERNIA REPAIR     stomach ulcer surgery     Social History   Socioeconomic History   Marital status: Widowed    Spouse name: Not on file   Number of children: Not on file   Years of education: Not on file   Highest  education level: Not on file  Occupational History   Not on file  Tobacco Use   Smoking status: Every Day   Smokeless tobacco: Never  Substance and Sexual Activity   Alcohol use: Yes    Alcohol/week: 3.0 standard drinks    Types: 3 Standard drinks or equivalent per week   Drug use: No   Sexual activity: Not on file  Other Topics Concern   Not on file  Social History Narrative   Not on file   Social Determinants of Health   Financial Resource Strain: Not on file  Food Insecurity: Not on file  Transportation Needs: Not on file  Physical Activity: Not on file  Stress: Not on file  Social Connections: Not on file  Intimate Partner Violence: Not on file   Family History  Problem Relation Age of Onset   Heart disease Mother    Kidney cancer Neg Hx    Bladder Cancer Neg Hx    Prostate cancer Neg Hx    Current Outpatient Medications on File Prior to Visit  Medication Sig   aspirin EC 81 MG tablet Take 81 mg by mouth daily.   LACTOBACILLUS RHAMNOSUS, GG, PO Take 1 capsule by mouth daily.    lisinopril (PRINIVIL,ZESTRIL) 10 MG tablet Take 10 mg by mouth daily.  Multiple Vitamin (MULTI-VITAMINS) TABS Take 1 tablet by mouth daily.    Multiple Vitamin (MULTIVITAMIN) tablet Take 1 tablet by mouth daily.   pantoprazole (PROTONIX) 40 MG tablet Take 40 mg by mouth daily.   simvastatin (ZOCOR) 40 MG tablet Take 40 mg by mouth daily.   No current facility-administered medications on file prior to visit.    Review of Systems Per HPI unless specifically indicated above      Objective:    BP 108/64   Pulse 76   Ht 6' (1.829 m)   Wt 215 lb 9.6 oz (97.8 kg)   SpO2 99%   BMI 29.24 kg/m   Wt Readings from Last 3 Encounters:  09/30/21 215 lb 9.6 oz (97.8 kg)  11/04/19 275 lb (124.7 kg)  11/28/16 260 lb (117.9 kg)    Physical Exam Vitals and nursing note reviewed.  Constitutional:      General: He is not in acute distress.    Appearance: Normal appearance. He is  well-developed. He is not diaphoretic.     Comments: Well-appearing, comfortable, cooperative  HENT:     Head: Normocephalic and atraumatic.  Eyes:     General:        Right eye: No discharge.        Left eye: No discharge.     Conjunctiva/sclera: Conjunctivae normal.  Cardiovascular:     Rate and Rhythm: Normal rate.  Pulmonary:     Effort: Pulmonary effort is normal.  Skin:    General: Skin is warm and dry.     Findings: No erythema or rash.  Neurological:     Mental Status: He is alert and oriented to person, place, and time.  Psychiatric:        Mood and Affect: Mood normal.        Behavior: Behavior normal.        Thought Content: Thought content normal.     Comments: Well groomed, good eye contact, normal speech and thoughts   Results for orders placed or performed during the hospital encounter of 11/04/19  SARS Coronavirus 2 by RT PCR (hospital order, performed in [facility] hospital lab) Nasopharyngeal Nasopharyngeal Swab   Specimen: Nasopharyngeal Swab  Result Value Ref Range   SARS Coronavirus 2 NEGATIVE NEGATIVE  CBC with Differential/Platelet  Result Value Ref Range   WBC 13.4 (H) 4.0 - 10.5 K/uL   RBC 5.59 4.22 - 5.81 MIL/uL   Hemoglobin 17.2 (H) 13.0 - 17.0 g/dL   HCT 49.5 39.0 - 52.0 %   MCV 88.6 80.0 - 100.0 fL   MCH 30.8 26.0 - 34.0 pg   MCHC 34.7 30.0 - 36.0 g/dL   RDW 13.0 11.5 - 15.5 %   Platelets 185 150 - 400 K/uL   nRBC 0.0 0.0 - 0.2 %   Neutrophils Relative % 77 %   Neutro Abs 10.5 (H) 1.7 - 7.7 K/uL   Lymphocytes Relative 15 %   Lymphs Abs 2.0 0.7 - 4.0 K/uL   Monocytes Relative 5 %   Monocytes Absolute 0.7 0.1 - 1.0 K/uL   Eosinophils Relative 1 %   Eosinophils Absolute 0.1 0.0 - 0.5 K/uL   Basophils Relative 1 %   Basophils Absolute 0.1 0.0 - 0.1 K/uL   Immature Granulocytes 1 %   Abs Immature Granulocytes 0.09 (H) 0.00 - 0.07 K/uL  Comprehensive metabolic panel  Result Value Ref Range   Sodium 138 135 - 145 mmol/L   Potassium 4.3  3.5 - 5.1  mmol/L   Chloride 101 98 - 111 mmol/L   CO2 25 22 - 32 mmol/L   Glucose, Bld 205 (H) 70 - 99 mg/dL   BUN 12 8 - 23 mg/dL   Creatinine, Ser 0.73 0.61 - 1.24 mg/dL   Calcium 9.4 8.9 - 10.3 mg/dL   Total Protein 7.8 6.5 - 8.1 g/dL   Albumin 4.7 3.5 - 5.0 g/dL   AST 35 15 - 41 U/L   ALT 38 0 - 44 U/L   Alkaline Phosphatase 65 38 - 126 U/L   Total Bilirubin 1.2 0.3 - 1.2 mg/dL   GFR calc non Af Amer >60 >60 mL/min   GFR calc Af Amer >60 >60 mL/min   Anion gap 12 5 - 15  Protime-INR  Result Value Ref Range   Prothrombin Time 12.8 11.4 - 15.2 seconds   INR 1.0 0.8 - 1.2  APTT  Result Value Ref Range   aPTT 32 24 - 36 seconds  Hemoglobin A1c  Result Value Ref Range   Hgb A1c MFr Bld 7.8 (H) 4.8 - 5.6 %   Mean Plasma Glucose 177 mg/dL  Lipid panel  Result Value Ref Range   Cholesterol 142 0 - 200 mg/dL   Triglycerides 178 (H) <150 mg/dL   HDL 30 (L) >40 mg/dL   Total CHOL/HDL Ratio 4.7 RATIO   VLDL 36 0 - 40 mg/dL   LDL Cholesterol 76 0 - 99 mg/dL  Hemoglobin A1c  Result Value Ref Range   Hgb A1c MFr Bld 7.9 (H) 4.8 - 5.6 %   Mean Plasma Glucose 180 mg/dL  CBC  Result Value Ref Range   WBC 11.4 (H) 4.0 - 10.5 K/uL   RBC 4.83 4.22 - 5.81 MIL/uL   Hemoglobin 14.9 13.0 - 17.0 g/dL   HCT 44.0 39.0 - 52.0 %   MCV 91.1 80.0 - 100.0 fL   MCH 30.8 26.0 - 34.0 pg   MCHC 33.9 30.0 - 36.0 g/dL   RDW 13.1 11.5 - 15.5 %   Platelets 180 150 - 400 K/uL   nRBC 0.0 0.0 - 0.2 %  Comprehensive metabolic panel  Result Value Ref Range   Sodium 140 135 - 145 mmol/L   Potassium 3.8 3.5 - 5.1 mmol/L   Chloride 103 98 - 111 mmol/L   CO2 28 22 - 32 mmol/L   Glucose, Bld 170 (H) 70 - 99 mg/dL   BUN 11 8 - 23 mg/dL   Creatinine, Ser 0.68 0.61 - 1.24 mg/dL   Calcium 8.8 (L) 8.9 - 10.3 mg/dL   Total Protein 6.3 (L) 6.5 - 8.1 g/dL   Albumin 3.7 3.5 - 5.0 g/dL   AST 24 15 - 41 U/L   ALT 33 0 - 44 U/L   Alkaline Phosphatase 50 38 - 126 U/L   Total Bilirubin 0.9 0.3 - 1.2 mg/dL    GFR calc non Af Amer >60 >60 mL/min   GFR calc Af Amer >60 >60 mL/min   Anion gap 9 5 - 15  Glucose, capillary  Result Value Ref Range   Glucose-Capillary 241 (H) 70 - 99 mg/dL  Glucose, capillary  Result Value Ref Range   Glucose-Capillary 157 (H) 70 - 99 mg/dL  Glucose, capillary  Result Value Ref Range   Glucose-Capillary 220 (H) 70 - 99 mg/dL  Glucose, capillary  Result Value Ref Range   Glucose-Capillary 233 (H) 70 - 99 mg/dL  Glucose, capillary  Result Value Ref Range   Glucose-Capillary  152 (H) 70 - 99 mg/dL  Glucose, capillary  Result Value Ref Range   Glucose-Capillary 154 (H) 70 - 99 mg/dL  Glucose, capillary  Result Value Ref Range   Glucose-Capillary 214 (H) 70 - 99 mg/dL  ECHOCARDIOGRAM COMPLETE  Result Value Ref Range   Weight 4,400 oz   Height 72 in   BP 111/62 mmHg      Assessment & Plan:   Problem List Items Addressed This Visit     Type 2 diabetes mellitus with other circulatory complications (HCC)   PAD (peripheral artery disease) (HCC)   HYPERTENSION, BENIGN ESSENTIAL   Hyperlipidemia associated with type 2 diabetes mellitus ([facility]) - Primary   History of pulmonary embolus (PE)   Benign prostatic hyperplasia with urinary frequency    Updated Health Maintenance information Encouraged improvement to lifestyle with diet and exercise Goal of weight loss  Reviewed past records PCP and specialist  Continue OTC Saw Palmetto for BPH. Offer repeat alpha blocker trial or 5 alpha reductase if interested or referral back to Urology. Previously no longer can return to [facility]. We would consider [facility] pending insurance network.  PAD S/p fem-pop bypass Left lower leg He would warrant vascular follow-up now >10 years since procedure. He will check insurance for network Notify us when ready   No orders of the defined types were placed in this encounter.     Follow up plan: Return in about 2 months (around 11/30/2021) for 2 month fasting lab only  then 1 week later Annual Physical.  Future labs. 11/29/21  Nobie Putnam, [facility] 09/30/2021, 10:18 AM

## 2021-11-29 ENCOUNTER — Other Ambulatory Visit: Payer: Medicare HMO

## 2021-11-29 DIAGNOSIS — R351 Nocturia: Secondary | ICD-10-CM | POA: Diagnosis not present

## 2021-11-29 DIAGNOSIS — Z0000 Encounter for general adult medical examination without abnormal findings: Secondary | ICD-10-CM

## 2021-11-29 DIAGNOSIS — E1159 Type 2 diabetes mellitus with other circulatory complications: Secondary | ICD-10-CM

## 2021-11-29 DIAGNOSIS — R350 Frequency of micturition: Secondary | ICD-10-CM | POA: Diagnosis not present

## 2021-11-29 DIAGNOSIS — E1169 Type 2 diabetes mellitus with other specified complication: Secondary | ICD-10-CM | POA: Diagnosis not present

## 2021-11-29 DIAGNOSIS — Z125 Encounter for screening for malignant neoplasm of prostate: Secondary | ICD-10-CM | POA: Diagnosis not present

## 2021-11-29 DIAGNOSIS — N401 Enlarged prostate with lower urinary tract symptoms: Secondary | ICD-10-CM | POA: Diagnosis not present

## 2021-11-29 DIAGNOSIS — E785 Hyperlipidemia, unspecified: Secondary | ICD-10-CM | POA: Diagnosis not present

## 2021-11-30 LAB — HEMOGLOBIN A1C
Hgb A1c MFr Bld: 7.7 % of total Hgb — ABNORMAL HIGH (ref <5.7)
Mean Plasma Glucose: 174 mg/dL
eAG (mmol/L): 9.7 mmol/L

## 2021-11-30 LAB — CBC WITH DIFFERENTIAL/PLATELET
Absolute Monocytes: 670 cells/uL (ref 200–950)
Basophils Absolute: 100 cells/uL (ref 0–200)
Basophils Relative: 1 %
Eosinophils Absolute: 300 cells/uL (ref 15–500)
Eosinophils Relative: 3 %
HCT: 50.4 % — ABNORMAL HIGH (ref 38.5–50.0)
Hemoglobin: 16.6 g/dL (ref 13.2–17.1)
Lymphs Abs: 2650 cells/uL (ref 850–3900)
MCH: 30.5 pg (ref 27.0–33.0)
MCHC: 32.9 g/dL (ref 32.0–36.0)
MCV: 92.6 fL (ref 80.0–100.0)
MPV: 10.2 fL (ref 7.5–12.5)
Monocytes Relative: 6.7 %
Neutro Abs: 6280 cells/uL (ref 1500–7800)
Neutrophils Relative %: 62.8 %
Platelets: 190 10*3/uL (ref 140–400)
RBC: 5.44 10*6/uL (ref 4.20–5.80)
RDW: 13 % (ref 11.0–15.0)
Total Lymphocyte: 26.5 %
WBC: 10 10*3/uL (ref 3.8–10.8)

## 2021-11-30 LAB — LIPID PANEL
Cholesterol: 148 mg/dL (ref <200)
HDL: 38 mg/dL — ABNORMAL LOW (ref >=40)
LDL Cholesterol (Calc): 85 mg/dL (calc)
Non-HDL Cholesterol (Calc): 110 mg/dL (calc) (ref <130)
Total CHOL/HDL Ratio: 3.9 (calc) (ref <5.0)
Triglycerides: 156 mg/dL — ABNORMAL HIGH (ref <150)

## 2021-11-30 LAB — COMPLETE METABOLIC PANEL WITH GFR
AG Ratio: 2.1 (calc) (ref 1.0–2.5)
ALT: 19 U/L (ref 9–46)
AST: 14 U/L (ref 10–35)
Albumin: 4.5 g/dL (ref 3.6–5.1)
Alkaline phosphatase (APISO): 78 U/L (ref 35–144)
BUN/Creatinine Ratio: 15 (calc) (ref 6–22)
BUN: 10 mg/dL (ref 7–25)
CO2: 27 mmol/L (ref 20–32)
Calcium: 9.2 mg/dL (ref 8.6–10.3)
Chloride: 104 mmol/L (ref 98–110)
Creat: 0.67 mg/dL — ABNORMAL LOW (ref 0.70–1.28)
Globulin: 2.1 g/dL (calc) (ref 1.9–3.7)
Glucose, Bld: 156 mg/dL — ABNORMAL HIGH (ref 65–99)
Potassium: 4.2 mmol/L (ref 3.5–5.3)
Sodium: 140 mmol/L (ref 135–146)
Total Bilirubin: 0.6 mg/dL (ref 0.2–1.2)
Total Protein: 6.6 g/dL (ref 6.1–8.1)
eGFR: 98 mL/min/{1.73_m2} (ref >=60)

## 2021-11-30 LAB — TSH: TSH: 2.28 mIU/L (ref 0.40–4.50)

## 2021-11-30 LAB — PSA: PSA: 2.32 ng/mL (ref <=4.00)

## 2021-12-06 ENCOUNTER — Ambulatory Visit (INDEPENDENT_AMBULATORY_CARE_PROVIDER_SITE_OTHER): Payer: No Typology Code available for payment source | Admitting: Family Medicine

## 2021-12-06 ENCOUNTER — Encounter: Payer: Self-pay | Admitting: Family Medicine

## 2021-12-06 VITALS — BP 119/65 | HR 81 | Ht 72.0 in | Wt 221.6 lb

## 2021-12-06 DIAGNOSIS — Z23 Encounter for immunization: Secondary | ICD-10-CM

## 2021-12-06 DIAGNOSIS — L821 Other seborrheic keratosis: Secondary | ICD-10-CM | POA: Diagnosis not present

## 2021-12-06 DIAGNOSIS — E1159 Type 2 diabetes mellitus with other circulatory complications: Secondary | ICD-10-CM | POA: Diagnosis not present

## 2021-12-06 DIAGNOSIS — I739 Peripheral vascular disease, unspecified: Secondary | ICD-10-CM | POA: Diagnosis not present

## 2021-12-06 DIAGNOSIS — N401 Enlarged prostate with lower urinary tract symptoms: Secondary | ICD-10-CM | POA: Diagnosis not present

## 2021-12-06 DIAGNOSIS — K219 Gastro-esophageal reflux disease without esophagitis: Secondary | ICD-10-CM

## 2021-12-06 DIAGNOSIS — R350 Frequency of micturition: Secondary | ICD-10-CM

## 2021-12-06 DIAGNOSIS — E1169 Type 2 diabetes mellitus with other specified complication: Secondary | ICD-10-CM | POA: Diagnosis not present

## 2021-12-06 DIAGNOSIS — E785 Hyperlipidemia, unspecified: Secondary | ICD-10-CM | POA: Diagnosis not present

## 2021-12-06 DIAGNOSIS — Z0000 Encounter for general adult medical examination without abnormal findings: Secondary | ICD-10-CM

## 2021-12-06 MED ORDER — PANTOPRAZOLE SODIUM 40 MG PO TBEC: 40.0000 mg | DELAYED_RELEASE_TABLET | Freq: Every day | ORAL | 3 refills | Status: DC

## 2021-12-06 MED ORDER — TAMSULOSIN HCL 0.4 MG PO CAPS: 0.4000 mg | ORAL_CAPSULE | Freq: Every day | ORAL | 3 refills | Status: DC

## 2021-12-06 MED ORDER — LISINOPRIL 10 MG PO TABS: 10.0000 mg | ORAL_TABLET | Freq: Every day | ORAL | 3 refills | Status: DC

## 2021-12-06 MED ORDER — SIMVASTATIN 40 MG PO TABS: 40.0000 mg | ORAL_TABLET | Freq: Every day | ORAL | 3 refills | Status: DC

## 2021-12-06 NOTE — Assessment & Plan Note (Signed)
Well-controlled DM with A1c 7.7 for age, goal is < 8 Keep improving, he is diet controlled Complications - history of stroke, PAD  Plan:  1. Discuss future management if elevated A1c or hyperglycemia 2. Goal to focus on diet now - Encourage improved lifestyle - low carb, low sugar diet, reduce portion size, continue improving regular exercise 3. Check CBG , bring log to next visit for review 4. Continue ASA, ACEi, Statin

## 2021-12-06 NOTE — Progress Notes (Signed)
Subjective:    Patient ID: [name], male    DOB: Feb 04, 1946, 75 y.o.   MRN: [id number]  [name] is a 76 y.o. male presenting on 12/06/2021 for Annual Exam   HPI  Here for Annual Physical and Lab Review.   CHRONIC DM, Type 2: Last A1c on chart 7.9 back in 2 years ago, now down to A1c 7.7 Attributed to ice cream and sweets. he will reduce sweet ice tea and sodas Reports controlled w/ lifestyle CBGs: Not checking regularly Meds: None. Currently on ACEi Denies hypoglycemia, polyuria, visual changes, numbness or tingling.   BPH, w LUTS He wears adult diaper Uses OTC Saw Palmetto TID, limited relief No longer w/ [facility] Resumed Tamsulosin 0.35m daily with some help   HYPERLIPIDEMIA: - Reports no concerns. 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  Health Maintenance:  PSA 2.32 (11/2021) negative      No data to display          Past Medical History:  Diagnosis Date   COPD [place] disease) [place]   Coronary artery disease    Diabetes mellitus without complication (HCC)    Gastric ulcer with hemorrhage    GERD (gastroesophageal reflux disease)    Headache    Hypercholesterolemia    Peripheral vascular disease ([place]   Prostate enlargement    Skin cancer    Sleep apnea    Past Surgical History:  Procedure Laterality Date   CHOLECYSTECTOMY     circumscision     from army surgery for epididymitis   COLONOSCOPY WITH PROPOFOL N/A 07/28/2015   Procedure: COLONOSCOPY WITH PROPOFOL;  Surgeon: RoManya SilvasMD;  [place]: [place];   Laterality: N/A;   CORONARY ANGIOPLASTY  x2   HERNIA REPAIR     stomach ulcer surgery     Social History   Socioeconomic History   Marital status: Widowed    Spouse name: Not on file   Number of children: Not on file   Years of education: Not on file   Highest education level: Not on file  Occupational History   Not on file  Tobacco Use   Smoking status: Every Day   Smokeless tobacco: Never  Substance and Sexual Activity   Alcohol use: Yes    Alcohol/week: 3.0 standard drinks of alcohol    Types: 3 Standard drinks or equivalent per week   Drug use: No   Sexual activity: Not on file  Other Topics Concern   Not on file  Social History Narrative   Not on file   Social Determinants of Health   Financial Resource Strain: Not on file  Food Insecurity: Not on file  Transportation Needs: Not on file  Physical Activity: Not on file  Stress: Not on file  Social Connections: Not on file  Intimate Partner Violence: Not on file   Family History  Problem Relation Age of Onset   Heart disease Mother    Kidney cancer Neg Hx    Bladder Cancer Neg Hx    Prostate cancer Neg Hx  Current Outpatient Medications on File Prior to Visit  Medication Sig   aspirin EC 81 MG tablet Take 81 mg by mouth daily.   LACTOBACILLUS RHAMNOSUS, GG, PO Take 1 capsule by mouth daily.    Multiple Vitamin (MULTI-VITAMINS) TABS Take 1 tablet by mouth daily.    Multiple Vitamin (MULTIVITAMIN) tablet Take 1 tablet by mouth daily.   No current facility-administered medications on file prior to visit.    Review of Systems  Constitutional:  Negative for activity change, appetite change, chills, diaphoresis, fatigue and fever.  HENT:  Negative for congestion and hearing loss.   Eyes:  Negative for visual disturbance.  Respiratory:  Negative for cough, chest tightness, shortness of breath and wheezing.   Cardiovascular:  Negative for chest pain, palpitations and leg swelling.  Gastrointestinal:   Negative for abdominal pain, constipation, diarrhea, nausea and vomiting.  Genitourinary:  Negative for dysuria, frequency and hematuria.  Musculoskeletal:  Negative for arthralgias and neck pain.  Skin:  Negative for rash.  Neurological:  Negative for dizziness, weakness, light-headedness, numbness and headaches.  Hematological:  Negative for adenopathy.  Psychiatric/Behavioral:  Negative for behavioral problems, dysphoric mood and sleep disturbance.    Per HPI unless specifically indicated above      Objective:    BP 119/65   Pulse 81   Ht 6' (1.829 m)   Wt 221 lb 9.6 oz (100.5 kg)   SpO2 97%   BMI 30.05 kg/m   Wt Readings from Last 3 Encounters:  12/06/21 221 lb 9.6 oz (100.5 kg)  09/30/21 215 lb 9.6 oz (97.8 kg)  11/04/19 275 lb (124.7 kg)    Physical Exam Vitals and nursing note reviewed.  Constitutional:      General: He is not in acute distress.    Appearance: He is well-developed. He is not diaphoretic.     Comments: Well-appearing, comfortable, cooperative  HENT:     Head: Normocephalic and atraumatic.  Eyes:     General:        Right eye: No discharge.        Left eye: No discharge.     Conjunctiva/sclera: Conjunctivae normal.     Pupils: Pupils are equal, round, and reactive to light.  Neck:     Thyroid: No thyromegaly.  Cardiovascular:     Rate and Rhythm: Normal rate and regular rhythm.     Pulses: Normal pulses.     Heart sounds: Normal heart sounds. No murmur heard. Pulmonary:     Effort: Pulmonary effort is normal. No respiratory distress.     Breath sounds: Normal breath sounds. No wheezing or rales.  Abdominal:     General: Bowel sounds are normal. There is no distension.     Palpations: Abdomen is soft. There is no mass.     Tenderness: There is no abdominal tenderness.  Musculoskeletal:        General: No tenderness. Normal range of motion.     Cervical back: Normal range of motion and neck supple.     Right lower leg: No edema.     Left  lower leg: No edema.     Comments: Upper / Lower Extremities: - Normal muscle tone, strength bilateral upper extremities 5/5, lower extremities 5/5  Lymphadenopathy:     Cervical: No cervical adenopathy.  Skin:    General: Skin is warm and dry.     Findings: Lesion (multiple SKs on back, one R sided upper large multiple appendages, non tender.) present. No erythema or rash.  Neurological:     Mental Status: He is alert and oriented to person, place, and time.     Comments: Distal sensation intact to light touch all extremities  Psychiatric:        Mood and Affect: Mood normal.        Behavior: Behavior normal.        Thought Content: Thought content normal.     Comments: Well groomed, good eye contact, normal speech and thoughts     Diabetic Foot Exam - Simple   Simple Foot Form Diabetic Foot exam was performed with the following findings: Yes 12/06/2021  3:18 PM  Visual Inspection See comments: Yes Sensation Testing Intact to touch and monofilament testing bilaterally: Yes Pulse Check Posterior Tibialis and Dorsalis pulse intact bilaterally: Yes Comments Bilateral feet with heel callus, no ulceration. Intact monofilament sensation.     Results for orders placed or performed in visit on 11/29/21  TSH  Result Value Ref Range   TSH 2.28 0.40 - 4.50 mIU/L  PSA  Result Value Ref Range   PSA 2.32 < OR = 4.00 ng/mL  Hemoglobin A1c  Result Value Ref Range   Hgb A1c MFr Bld 7.7 (H) <5.7 % of total Hgb   Mean Plasma Glucose 174 mg/dL   eAG (mmol/L) 9.7 mmol/L  Lipid panel  Result Value Ref Range   Cholesterol 148 <200 mg/dL   HDL 38 (L) > OR = 40 mg/dL   Triglycerides 156 (H) <150 mg/dL   LDL Cholesterol (Calc) 85 mg/dL (calc)   Total CHOL/HDL Ratio 3.9 <5.0 (calc)   Non-HDL Cholesterol (Calc) 110 <130 mg/dL (calc)  CBC with Differential/Platelet  Result Value Ref Range   WBC 10.0 3.8 - 10.8 Thousand/uL   RBC 5.44 4.20 - 5.80 Million/uL   Hemoglobin 16.6 13.2 - 17.1  g/dL   HCT 50.4 (H) 38.5 - 50.0 %   MCV 92.6 80.0 - 100.0 fL   MCH 30.5 27.0 - 33.0 pg   MCHC 32.9 32.0 - 36.0 g/dL   RDW 13.0 11.0 - 15.0 %   Platelets 190 140 - 400 Thousand/uL   MPV 10.2 7.5 - 12.5 fL   Neutro Abs 6,280 1,500 - 7,800 cells/uL   Lymphs Abs 2,650 850 - 3,900 cells/uL   Absolute Monocytes 670 200 - 950 cells/uL   Eosinophils Absolute 300 15 - 500 cells/uL   Basophils Absolute 100 0 - 200 cells/uL   Neutrophils Relative % 62.8 %   Total Lymphocyte 26.5 %   Monocytes Relative 6.7 %   Eosinophils Relative 3.0 %   Basophils Relative 1.0 %  COMPLETE METABOLIC PANEL WITH GFR  Result Value Ref Range   Glucose, Bld 156 (H) 65 - 99 mg/dL   BUN 10 7 - 25 mg/dL   Creat 0.67 (L) 0.70 - 1.28 mg/dL   eGFR 98 > OR = 60 mL/min/1.53m   BUN/Creatinine Ratio 15 6 - 22 (calc)   Sodium 140 135 - 146 mmol/L   Potassium 4.2 3.5 - 5.3 mmol/L   Chloride 104 98 - 110 mmol/L   CO2 27 20 - 32 mmol/L   Calcium 9.2 8.6 - 10.3 mg/dL   Total Protein 6.6 6.1 - 8.1 g/dL   Albumin 4.5 3.6 - 5.1 g/dL   Globulin 2.1 1.9 - 3.7 g/dL (calc)   AG Ratio 2.1 1.0 - 2.5 (calc)   Total Bilirubin 0.6 0.2 - 1.2 mg/dL   Alkaline phosphatase (APISO) 78 35 - 144 U/L   AST  14 10 - 35 U/L   ALT 19 9 - 46 U/L      Assessment & Plan:   Problem List Items Addressed This Visit     Benign prostatic hyperplasia with urinary frequency   Relevant Medications   tamsulosin (FLOMAX) 0.4 MG CAPS capsule   Hyperlipidemia associated with type 2 diabetes mellitus (HCC)    Controlled cholesterol on statin lifestyle  Plan: 1. Continue current meds - Simvastatin 83m 2. Continue ASA 828mfor primary ASCVD risk reduction 3. Encourage improved lifestyle - low carb/cholesterol, reduce portion size, continue improving regular exercise      Relevant Medications   simvastatin (ZOCOR) 40 MG tablet   lisinopril (ZESTRIL) 10 MG tablet   PAD (peripheral artery disease) (HCC)   Relevant Medications   simvastatin  (ZOCOR) 40 MG tablet   lisinopril (ZESTRIL) 10 MG tablet   Type 2 diabetes mellitus with other circulatory complications (HCC)    Well-controlled DM with A1c 7.7 for age, goal is < 8 Keep improving, he is diet controlled Complications - history of stroke, PAD  Plan:  1. Discuss future management if elevated A1c or hyperglycemia 2. Goal to focus on diet now - Encourage improved lifestyle - low carb, low sugar diet, reduce portion size, continue improving regular exercise 3. Check CBG , bring log to next visit for review 4. Continue ASA, ACEi, Statin      Relevant Medications   simvastatin (ZOCOR) 40 MG tablet   lisinopril (ZESTRIL) 10 MG tablet   Other Visit Diagnoses     Annual physical exam    -  Primary   Gastroesophageal reflux disease without esophagitis       Relevant Medications   pantoprazole (PROTONIX) 40 MG tablet   Need for shingles vaccine       Relevant Orders   Varicella-zoster vaccine IM (Completed)   Seborrheic keratoses           Updated Health Maintenance information Reviewed recent lab results with patient Encouraged improvement to lifestyle with diet and exercise Goal of weight loss  Double vision mild History of CVA I advised him that I would recommend pursuing further work up with his Optometry first and also pursue Neurology consultation, he prefers to hold off on Neuro today. We could refer to [facility] return if / when he is ready. Also I advised that I would consider anticoagulation therapy for low dose stroke prevention. He could take Xarelto 2.29m67mID dosing along with Aspirin 81 daily to help reduce future stroke risk.   Orders Placed This Encounter  Procedures   Varicella-zoster vaccine IM     Meds ordered this encounter  Medications   tamsulosin (FLOMAX) 0.4 MG CAPS capsule    Sig: Take 1 capsule (0.4 mg total) by mouth daily after breakfast.    Dispense:  90 capsule    Refill:  3    Add refills, 90 day   simvastatin  (ZOCOR) 40 MG tablet    Sig: Take 1 tablet (40 mg total) by mouth daily.    Dispense:  90 tablet    Refill:  3   lisinopril (ZESTRIL) 10 MG tablet    Sig: Take 1 tablet (10 mg total) by mouth daily.    Dispense:  90 tablet    Refill:  3   pantoprazole (PROTONIX) 40 MG tablet    Sig: Take 1 tablet (40 mg total) by mouth daily before breakfast.    Dispense:  90 tablet  Refill:  3     Follow up plan: Return in about 6 months (around 06/08/2022) for 6 follow up DM A1c, Shingles vaccine #2.  Nobie Putnam, [facility] 12/06/2021, 2:59 PM

## 2021-12-06 NOTE — Assessment & Plan Note (Signed)
Controlled cholesterol on statin lifestyle  Plan: 1. Continue current meds - Simvastatin '40mg'$  2. Continue ASA '81mg'$  for primary ASCVD risk reduction 3. Encourage improved lifestyle - low carb/cholesterol, reduce portion size, continue improving regular exercise

## 2021-12-06 NOTE — Patient Instructions (Addendum)
Thank you for coming to the office today.  Diet Recommendations for Diabetes   REDUCE Starchy (carb) foods include: Bread, rice, pasta, potatoes, corn, crackers, bagels, muffins, all baked goods.   FRUITS - LIMIT these HIGH sugar/carb fruits = Pineapple, Watermelon, Bananas - OKAY with these MEDIUM sugar/carb fruits = Citrus, Oranges, Grapes - PREFER these LOW sugar/carb fruits = Apples, Berries, Pears, Plums  Protein foods include: Meat, fish, poultry, eggs, dairy foods, and beans such as pinto and kidney beans (beans also provide carbohydrate).   ---------------------   Ordered your medications today.   For the double vision  Try to re-schedule with Dr [facility]   Address: [address] [place] [address] Phone: [phone]  Website: [email or web address]  --------------------------  And also if you prefer, we can refer you to the Neurology group in town for further evaluation, may pursue a Head Scan.  Dr [facility] - Neurology Dept [place], [place] [address] Phone: [phone]  If interested in additional medication we can order very low dose Xarelto for stroke prevention, please let me know if interested.  Please schedule a Follow-up Appointment to: Return in about 6 months (around 06/08/2022) for 6 follow up DM A1c, Shingles vaccine #2.  If you have any other questions or concerns, please feel free to call the office or send a message through [facility]. You may also schedule an earlier appointment if necessary.  Additionally, you may be receiving a survey about your experience at our office within a few days to 1 week by e-mail or mail. We value your feedback.  Nobie Putnam, DO [place]

## 2021-12-07 ENCOUNTER — Telehealth: Payer: Self-pay | Admitting: Family Medicine

## 2021-12-07 NOTE — Telephone Encounter (Signed)
He is going to stop by after lunch to pick up the list.

## 2021-12-07 NOTE — Telephone Encounter (Signed)
Patient would like to pick up today due to him being in town a list of okay fruits PCP advised. Patient referenced AVS and noticed a short list but would like PCP to list fruit that will not affect his blood sugar. Please call when ready to pick up.

## 2021-12-07 NOTE — Telephone Encounter (Signed)
I gave him a short printed list of fruit options and a AVS handout with diabetic diet including fruit information.  If he wants more, I have printed a new reference for him from the American Diabetes Association ADA website  It is ready to pick up anytime today. 12/07/21 he requested  Nobie Putnam, [facility] 12/07/2021, 10:54 AM

## 2021-12-09 DIAGNOSIS — E119 Type 2 diabetes mellitus without complications: Secondary | ICD-10-CM | POA: Diagnosis not present

## 2021-12-09 DIAGNOSIS — H5203 Hypermetropia, bilateral: Secondary | ICD-10-CM | POA: Diagnosis not present

## 2021-12-09 DIAGNOSIS — H26493 Other secondary cataract, bilateral: Secondary | ICD-10-CM | POA: Diagnosis not present

## 2021-12-09 DIAGNOSIS — H01009 Unspecified blepharitis unspecified eye, unspecified eyelid: Secondary | ICD-10-CM | POA: Diagnosis not present

## 2021-12-09 DIAGNOSIS — H04129 Dry eye syndrome of unspecified lacrimal gland: Secondary | ICD-10-CM | POA: Diagnosis not present

## 2021-12-09 LAB — HM DIABETES EYE EXAM

## 2021-12-14 ENCOUNTER — Encounter: Payer: Self-pay | Admitting: Family Medicine

## 2021-12-14 ENCOUNTER — Other Ambulatory Visit: Payer: Self-pay | Admitting: Family Medicine

## 2021-12-14 ENCOUNTER — Other Ambulatory Visit: Payer: Self-pay

## 2021-12-14 DIAGNOSIS — E1169 Type 2 diabetes mellitus with other specified complication: Secondary | ICD-10-CM

## 2021-12-14 DIAGNOSIS — E1159 Type 2 diabetes mellitus with other circulatory complications: Secondary | ICD-10-CM

## 2021-12-14 DIAGNOSIS — N401 Enlarged prostate with lower urinary tract symptoms: Secondary | ICD-10-CM

## 2021-12-14 DIAGNOSIS — K219 Gastro-esophageal reflux disease without esophagitis: Secondary | ICD-10-CM

## 2021-12-14 MED ORDER — SIMVASTATIN 40 MG PO TABS: 40.0000 mg | ORAL_TABLET | Freq: Every day | ORAL | 3 refills | Status: DC

## 2021-12-14 MED ORDER — LISINOPRIL 10 MG PO TABS: 10.0000 mg | ORAL_TABLET | Freq: Every day | ORAL | 3 refills | Status: DC

## 2021-12-14 MED ORDER — TAMSULOSIN HCL 0.4 MG PO CAPS: 0.4000 mg | ORAL_CAPSULE | Freq: Every day | ORAL | 3 refills | Status: DC

## 2021-12-14 MED ORDER — PANTOPRAZOLE SODIUM 40 MG PO TBEC: 40.0000 mg | DELAYED_RELEASE_TABLET | Freq: Every day | ORAL | 3 refills | Status: DC

## 2021-12-14 NOTE — Telephone Encounter (Signed)
Resending to different mail order pharmacy as previous one isn't covered on pt's insurance. Requested Prescriptions  Pending Prescriptions Disp Refills  . tamsulosin (FLOMAX) 0.4 MG CAPS capsule 90 capsule 3    Sig: Take 1 capsule (0.4 mg total) by mouth daily after breakfast.     Urology: Alpha-Adrenergic Blocker Passed - 12/14/2021 10:18 AM      Passed - PSA in normal range and within 360 days    PSA  Date Value Ref Range Status  11/29/2021 2.32 < OR = 4.00 ng/mL Final    Comment:    The total PSA value from this assay system is  standardized against the WHO standard. The test  result will be approximately 20% lower when compared  to the equimolar-standardized total PSA (Beckman  Coulter). Comparison of serial PSA results should be  interpreted with this fact in mind. . This test was performed using the Siemens  chemiluminescent method. Values obtained from  different assay methods cannot be used interchangeably. PSA levels, regardless of value, should not be interpreted as absolute evidence of the presence or absence of disease.    Prostate Specific Ag, Serum  Date Value Ref Range Status  10/30/2016 2.3 0.0 - 4.0 ng/mL Final    Comment:    Roche ECLIA methodology. According to the American Urological Association, Serum PSA should decrease and remain at undetectable levels after radical prostatectomy. The AUA defines biochemical recurrence as an initial PSA value 0.2 ng/mL or greater followed by a subsequent confirmatory PSA value 0.2 ng/mL or greater. Values obtained with different assay methods or kits cannot be used interchangeably. Results cannot be interpreted as absolute evidence of the presence or absence of malignant disease.          Passed - Last BP in normal range    BP Readings from Last 1 Encounters:  12/06/21 119/65         Passed - Valid encounter within last 12 months    Recent Outpatient Visits          1 week ago Annual physical exam   [place], DO   2 months ago Hyperlipidemia associated with type 2 diabetes mellitus [facility])   [facility] [place], Devonne Doughty, DO      Future Appointments            In 5 months [place], Devonne Doughty, DO [facility], [place]           . simvastatin (ZOCOR) 40 MG tablet 90 tablet 3    Sig: Take 1 tablet (40 mg total) by mouth daily.     Cardiovascular:  Antilipid - Statins Failed - 12/14/2021 10:18 AM      Failed - Lipid Panel in normal range within the last 12 months    Cholesterol  Date Value Ref Range Status  11/29/2021 148 <200 mg/dL Final  04/21/2012 107 0 - 200 mg/dL Final   Ldl Cholesterol, Calc  Date Value Ref Range Status  04/21/2012 47 0 - 100 mg/dL Final   LDL Cholesterol (Calc)  Date Value Ref Range Status  11/29/2021 85 mg/dL (calc) Final    Comment:    Reference range: <100 . Desirable range <100 mg/dL for primary prevention;   <70 mg/dL for patients with CHD or diabetic patients  with > or = 2 CHD risk factors. Marland Kitchen LDL-C is now calculated using the Martin-Hopkins  calculation, which is a validated novel method providing  better  accuracy than the Friedewald equation in the  estimation of LDL-C.  Cresenciano Genre et al. Annamaria Helling. 6222;979(89): 2061-2068  (http://education.[employer].com/faq/FAQ164)    HDL Cholesterol  Date Value Ref Range Status  04/21/2012 18 (L) 40 - 60 mg/dL Final   HDL  Date Value Ref Range Status  11/29/2021 38 (L) > OR = 40 mg/dL Final   Triglycerides  Date Value Ref Range Status  11/29/2021 156 (H) <150 mg/dL Final  04/21/2012 211 (H) 0 - 200 mg/dL Final         Passed - Patient is not pregnant      Passed - Valid encounter within last 12 months    Recent Outpatient Visits          1 week ago Annual physical exam   [place], DO   2 months ago Hyperlipidemia associated with type 2 diabetes mellitus [facility])    [facility] [place], Devonne Doughty, DO      Future Appointments            In 5 months Parks Ranger, Devonne Doughty, DO [facility], [place]           . pantoprazole (PROTONIX) 40 MG tablet 90 tablet 3    Sig: Take 1 tablet (40 mg total) by mouth daily before breakfast.     Gastroenterology: Proton Pump Inhibitors Passed - 12/14/2021 10:18 AM      Passed - Valid encounter within last 12 months    Recent Outpatient Visits          1 week ago Annual physical exam   [place], DO   2 months ago Hyperlipidemia associated with type 2 diabetes mellitus [facility])   [facility] [place], Devonne Doughty, DO      Future Appointments            In 5 months [place] Ranger, Devonne Doughty, DO [facility], [place]           . lisinopril (ZESTRIL) 10 MG tablet 90 tablet 3    Sig: Take 1 tablet (10 mg total) by mouth daily.     Cardiovascular:  ACE Inhibitors Failed - 12/14/2021 10:18 AM      Failed - Cr in normal range and within 180 days    Creat  Date Value Ref Range Status  11/29/2021 0.67 (L) 0.70 - 1.28 mg/dL Final         Passed - K in normal range and within 180 days    Potassium  Date Value Ref Range Status  11/29/2021 4.2 3.5 - 5.3 mmol/L Final  05/29/2012 4.3 3.5 - 5.1 mmol/L Final         Passed - Patient is not pregnant      Passed - Last BP in normal range    BP Readings from Last 1 Encounters:  12/06/21 119/65         Passed - Valid encounter within last 6 months    Recent Outpatient Visits          1 week ago Annual physical exam   [place], DO   2 months ago Hyperlipidemia associated with type 2 diabetes mellitus [facility])   [place], DO      Future Appointments            In 5 months Parks Ranger, Devonne Doughty, DO [facility], [place]

## 2021-12-14 NOTE — Telephone Encounter (Signed)
Pt gave this phone number [phone] this is to the pharmacy or [phone] pharmacy help desk

## 2021-12-14 NOTE — Telephone Encounter (Signed)
Pt stated that he asked Dr. Raliegh Ip about [facility] and his medications were sent there but his insurance doesn't work with that pharmacy so pt needs his meds sent to another pharmacy / he doesn't know the name of the pharmacy / he stated that they should have sent a fax to the office / please resend all refills to this pharmacy / please advise

## 2021-12-14 NOTE — Telephone Encounter (Signed)
Pt called to report that he needs his prescriptions sent to [place], [place] to [facility]  [address] [place] [address]  Phone: [phone] Fax: [phone]    He needs his prescriptions sent via the pharmacy listed above, Please resend the four recent prescriptions (tamsulosin (FLOMAX) 0.4 MG CAPS capsule ,  simvastatin (ZOCOR) 40 MG tablet, pantoprazole (PROTONIX) 40 MG tablet, lisinopril (ZESTRIL) 10 MG tablet)

## 2022-03-14 ENCOUNTER — Ambulatory Visit (INDEPENDENT_AMBULATORY_CARE_PROVIDER_SITE_OTHER): Payer: No Typology Code available for payment source

## 2022-03-14 DIAGNOSIS — Z23 Encounter for immunization: Secondary | ICD-10-CM | POA: Diagnosis not present

## 2022-04-11 ENCOUNTER — Telehealth: Payer: Self-pay | Admitting: Family Medicine

## 2022-04-11 NOTE — Telephone Encounter (Signed)
When I spoke with [name] she was informed that no one from the office called the pt and she stated that she was going to let the pt know.    When the pt was in the office he was given the original form and a copy was faxed to the dentist office.  Pt stated he understood and nothing else was needed from [place].

## 2022-04-11 NOTE — Telephone Encounter (Signed)
Brandy w/ [facility] calling to ask you please send the fax back to them regarding pt's med list. Pt is going to have surgery on Thurs.  They need to confirm all his meds are ok w/ what they are to do.  Theadora Rama states she faxed about 2 hrs ago and she is requesting this back asap. Cb [phone]

## 2022-04-11 NOTE — Telephone Encounter (Signed)
Patient came by with paperwork for Dr. Raliegh Ip to sign off on. It was filled out and given back to the patient to give to his dentist.

## 2022-04-11 NOTE — Telephone Encounter (Signed)
Patient, called backed, frustrated about call he received, not sure what else its about. He states the dentist office has received fax.

## 2022-04-12 ENCOUNTER — Ambulatory Visit (LOCAL_COMMUNITY_HEALTH_CENTER): Payer: No Typology Code available for payment source

## 2022-04-12 DIAGNOSIS — Z23 Encounter for immunization: Secondary | ICD-10-CM

## 2022-04-12 DIAGNOSIS — Z719 Counseling, unspecified: Secondary | ICD-10-CM

## 2022-04-12 NOTE — Progress Notes (Signed)
Client seen for COVID-19 vaccine.  VIS given.      Screening questions  Are you feeling sick today? No   Have you ever received a dose of COVID-19 Vaccine? [employer], [place], [place], [place], Other) Yes  If yes, which vaccine and how many doses?    [place]  5  Did you bring the vaccination record card or other documentation?  No   Do you have a health condition or are undergoing treatment that makes you moderately or severely immunocompromised? This would include, but not be limited to: cancer, HIV, organ transplant, immunosuppressive therapy/high-dose corticosteroids, or moderate/severe primary immunodeficiency.  No  Have you received COVID-19 vaccine before or during hematopoietic cell transplant (HCT) or CAR-T-cell therapies? No  Have you ever had an allergic reaction to: (This would include a severe allergic reaction or a reaction that caused hives, swelling, or respiratory distress, including wheezing.) A component of a COVID-19 vaccine or a previous dose of COVID-19 vaccine? No   Have you ever had an allergic reaction to another vaccine (other thanCOVID-19 vaccine) or an injectable medication? (This would include a severe allergic reaction or a reaction that caused hives, swelling, or respiratory distress, including wheezing.)   No    Do you have a history of any of the following:  Myocarditis or Pericarditis No  Dermal fillers:  No  Multisystem Inflammatory Syndrome (MIS-C or MIS-A)? No  COVID-19 disease within the past 3 months? No  Vaccinated with monkeypox vaccine in the last 4 weeks? No    Vaccine administered and tolerated well.   After vaccine care reviewed.  Copy of NCIR provided. Client waited 15 minutes post vaccination.     Federico Maiorino Shelda Pal, RN

## 2022-04-13 ENCOUNTER — Ambulatory Visit: Payer: Medicare Other

## 2022-04-14 ENCOUNTER — Ambulatory Visit: Payer: No Typology Code available for payment source

## 2022-04-24 ENCOUNTER — Ambulatory Visit (LOCAL_COMMUNITY_HEALTH_CENTER): Payer: No Typology Code available for payment source

## 2022-04-24 DIAGNOSIS — Z23 Encounter for immunization: Secondary | ICD-10-CM

## 2022-04-24 DIAGNOSIS — Z719 Counseling, unspecified: Secondary | ICD-10-CM

## 2022-04-24 NOTE — Progress Notes (Signed)
Client seen in nurse clinic for RSV vaccines. VIS given.  V-safe information provided.  Vaccine administered and tolerated well. After vaccine care reviewed.  Copy of NCIR provided.  Skylar Flynt Shelda Pal, RN

## 2022-04-27 DIAGNOSIS — I240 Acute coronary thrombosis not resulting in myocardial infarction: Secondary | ICD-10-CM | POA: Insufficient documentation

## 2022-04-27 DIAGNOSIS — I259 Chronic ischemic heart disease, unspecified: Secondary | ICD-10-CM | POA: Insufficient documentation

## 2022-04-28 ENCOUNTER — Ambulatory Visit: Payer: No Typology Code available for payment source

## 2022-04-28 ENCOUNTER — Ambulatory Visit (INDEPENDENT_AMBULATORY_CARE_PROVIDER_SITE_OTHER): Payer: No Typology Code available for payment source

## 2022-04-28 VITALS — BP 150/88 | Ht 72.0 in | Wt 220.2 lb

## 2022-04-28 DIAGNOSIS — Z0000 Encounter for general adult medical examination without abnormal findings: Secondary | ICD-10-CM

## 2022-04-28 NOTE — Patient Instructions (Signed)
[name] , Thank you for taking time to come for your Medicare Wellness Visit. I appreciate your ongoing commitment to your health goals. Please review the following plan we discussed and let me know if I can assist you in the future.   Screening recommendations/referrals: Colonoscopy: 07/28/15 Recommended yearly ophthalmology/optometry visit for glaucoma screening and checkup Recommended yearly dental visit for hygiene and checkup  Vaccinations: Influenza vaccine: 03/14/22 Pneumococcal vaccine: 09/01/14 Tdap vaccine: 01/14/08, due if have injury Shingles vaccine: Shingrix 12/06/21, need 2nd shot   Covid-19: 07/10/19, 08/05/19, 02/10/20, 04/12/22  Advanced directives: no  Conditions/risks identified: none  Next appointment: Follow up in one year for your annual wellness visit. - not made at pt request  Preventive Care 75 Years and Older, Male Preventive care refers to lifestyle choices and visits with your health care provider that can promote health and wellness. What does preventive care include? A yearly physical exam. This is also called an annual well check. Dental exams once or twice a year. Routine eye exams. Ask your health care provider how often you should have your eyes checked. Personal lifestyle choices, including: Daily care of your teeth and gums. Regular physical activity. Eating a healthy diet. Avoiding tobacco and drug use. Limiting alcohol use. Practicing safe sex. Taking low doses of aspirin every day. Taking vitamin and mineral supplements as recommended by your health care provider. What happens during an annual well check? The services and screenings done by your health care provider during your annual well check will depend on your age, overall health, lifestyle risk factors, and family history of disease. Counseling  Your health care provider may ask you questions about your: Alcohol use. Tobacco use. Drug use. Emotional well-being. Home and relationship  well-being. Sexual activity. Eating habits. History of falls. Memory and ability to understand (cognition). Work and work Statistician. Screening  You may have the following tests or measurements: Height, weight, and BMI. Blood pressure. Lipid and cholesterol levels. These may be checked every 5 years, or more frequently if you are over 64 years old. Skin check. Lung cancer screening. You may have this screening every year starting at age 5 if you have a 30-pack-year history of smoking and currently smoke or have quit within the past 15 years. Fecal occult blood test (FOBT) of the stool. You may have this test every year starting at age 15. Flexible sigmoidoscopy or colonoscopy. You may have a sigmoidoscopy every 5 years or a colonoscopy every 10 years starting at age 36. Prostate cancer screening. Recommendations will vary depending on your family history and other risks. Hepatitis C blood test. Hepatitis B blood test. Sexually transmitted disease (STD) testing. Diabetes screening. This is done by checking your blood sugar (glucose) after you have not eaten for a while (fasting). You may have this done every 1-3 years. Abdominal aortic aneurysm (AAA) screening. You may need this if you are a current or former smoker. Osteoporosis. You may be screened starting at age 69 if you are at high risk. Talk with your health care provider about your test results, treatment options, and if necessary, the need for more tests. Vaccines  Your health care provider may recommend certain vaccines, such as: Influenza vaccine. This is recommended every year. Tetanus, diphtheria, and acellular pertussis (Tdap, Td) vaccine. You may need a Td booster every 10 years. Zoster vaccine. You may need this after age 44. Pneumococcal 13-valent conjugate (PCV13) vaccine. One dose is recommended after age 82. Pneumococcal polysaccharide (PPSV23) vaccine. One dose is  recommended after age 60. Talk to your health care  provider about which screenings and vaccines you need and how often you need them. This information is not intended to replace advice given to you by your health care provider. Make sure you discuss any questions you have with your health care provider. Document Released: 05/28/2015 Document Revised: 01/19/2016 Document Reviewed: 03/02/2015 Elsevier Interactive Patient Education  2017 [place] Prevention in the Home Falls can cause injuries. They can happen to people of all ages. There are many things you can do to make your home safe and to help prevent falls. What can I do on the outside of my home? Regularly fix the edges of walkways and driveways and fix any cracks. Remove anything that might make you trip as you walk through a door, such as a raised step or threshold. Trim any bushes or trees on the path to your home. Use bright outdoor lighting. Clear any walking paths of anything that might make someone trip, such as rocks or tools. Regularly check to see if handrails are loose or broken. Make sure that both sides of any steps have handrails. Any raised decks and porches should have guardrails on the edges. Have any leaves, snow, or ice cleared regularly. Use sand or salt on walking paths during winter. Clean up any spills in your garage right away. This includes oil or grease spills. What can I do in the bathroom? Use night lights. Install grab bars by the toilet and in the tub and shower. Do not use towel bars as grab bars. Use non-skid mats or decals in the tub or shower. If you need to sit down in the shower, use a plastic, non-slip stool. Keep the floor dry. Clean up any water that spills on the floor as soon as it happens. Remove soap buildup in the tub or shower regularly. Attach bath mats securely with double-sided non-slip rug tape. Do not have throw rugs and other things on the floor that can make you trip. What can I do in the bedroom? Use night lights. Make  sure that you have a light by your bed that is easy to reach. Do not use any sheets or blankets that are too big for your bed. They should not hang down onto the floor. Have a firm chair that has side arms. You can use this for support while you get dressed. Do not have throw rugs and other things on the floor that can make you trip. What can I do in the kitchen? Clean up any spills right away. Avoid walking on wet floors. Keep items that you use a lot in easy-to-reach places. If you need to reach something above you, use a strong step stool that has a grab bar. Keep electrical cords out of the way. Do not use floor polish or wax that makes floors slippery. If you must use wax, use non-skid floor wax. Do not have throw rugs and other things on the floor that can make you trip. What can I do with my stairs? Do not leave any items on the stairs. Make sure that there are handrails on both sides of the stairs and use them. Fix handrails that are broken or loose. Make sure that handrails are as long as the stairways. Check any carpeting to make sure that it is firmly attached to the stairs. Fix any carpet that is loose or worn. Avoid having throw rugs at the top or bottom of the stairs. If  you do have throw rugs, attach them to the floor with carpet tape. Make sure that you have a light switch at the top of the stairs and the bottom of the stairs. If you do not have them, ask someone to add them for you. What else can I do to help prevent falls? Wear shoes that: Do not have high heels. Have rubber bottoms. Are comfortable and fit you well. Are closed at the toe. Do not wear sandals. If you use a stepladder: Make sure that it is fully opened. Do not climb a closed stepladder. Make sure that both sides of the stepladder are locked into place. Ask someone to hold it for you, if possible. Clearly mark and make sure that you can see: Any grab bars or handrails. First and last steps. Where the  edge of each step is. Use tools that help you move around (mobility aids) if they are needed. These include: Canes. Walkers. Scooters. Crutches. Turn on the lights when you go into a dark area. Replace any light bulbs as soon as they burn out. Set up your furniture so you have a clear path. Avoid moving your furniture around. If any of your floors are uneven, fix them. If there are any pets around you, be aware of where they are. Review your medicines with your doctor. Some medicines can make you feel dizzy. This can increase your chance of falling. Ask your doctor what other things that you can do to help prevent falls. This information is not intended to replace advice given to you by your health care provider. Make sure you discuss any questions you have with your health care provider. Document Released: 02/25/2009 Document Revised: 10/07/2015 Document Reviewed: 06/05/2014 Elsevier Interactive Patient Education  2017 [employer].

## 2022-04-28 NOTE — Progress Notes (Signed)
Subjective:   [name] is a 76 y.o. male who presents for Medicare Annual/Subsequent preventive examination.  Review of Systems     Cardiac Risk Factors include: advanced age (>59mn, >>39women);male gender     Objective:    Today's Vitals   04/28/22 1329 04/28/22 1330  BP: (!) 150/88   Weight: 220 lb 3.2 oz (99.9 kg)   Height: 6' (1.829 m)   PainSc:  3    Body mass index is 29.86 kg/m.     04/28/2022    1:39 PM 11/05/2019    2:00 AM 11/04/2019    5:43 PM  Advanced Directives  Does Patient Have a Medical Advance Directive? No No No  Would patient like information on creating a medical advance directive? No - Patient declined No - Patient declined No - Patient declined    Current Medications (verified) Outpatient Encounter Medications as of 04/28/2022  Medication Sig   aspirin EC 81 MG tablet Take 81 mg by mouth daily.   LACTOBACILLUS RHAMNOSUS, GG, PO Take 1 capsule by mouth daily.    lisinopril (ZESTRIL) 10 MG tablet Take 1 tablet (10 mg total) by mouth daily.   Multiple Vitamin (MULTI-VITAMINS) TABS Take 1 tablet by mouth daily.    Multiple Vitamin (MULTIVITAMIN) tablet Take 1 tablet by mouth daily.   pantoprazole (PROTONIX) 40 MG tablet Take 1 tablet (40 mg total) by mouth daily before breakfast.   simvastatin (ZOCOR) 40 MG tablet Take 1 tablet (40 mg total) by mouth daily.   tamsulosin (FLOMAX) 0.4 MG CAPS capsule Take 1 capsule (0.4 mg total) by mouth daily after breakfast.   No facility-administered encounter medications on file as of 04/28/2022.    Allergies (verified) Iodine   History: Past Medical History:  Diagnosis Date   COPD [facility] disease) [place]    Coronary artery disease    Diabetes mellitus without complication [facility]    Gastric ulcer with hemorrhage    GERD (gastroesophageal reflux disease)    Headache    Hypercholesterolemia    Peripheral vascular disease (HCC)    Prostate enlargement    Skin cancer    Sleep  apnea    Past Surgical History:  Procedure Laterality Date   CHOLECYSTECTOMY     circumscision     from [employer] surgery for epididymitis   COLONOSCOPY WITH PROPOFOL N/A 07/28/2015   Procedure: COLONOSCOPY WITH PROPOFOL;  Surgeon: RManya Silvas MD;  [facility]  Service: Endoscopy;  Laterality: N/A;   CORONARY ANGIOPLASTY  x2   HERNIA REPAIR     stomach ulcer surgery     Family History  Problem Relation Age of Onset   Heart disease Mother    Kidney cancer Neg Hx    Bladder Cancer Neg Hx    Prostate cancer Neg Hx    Social History   Socioeconomic History   Marital status: Widowed    Spouse name: Not on file   Number of children: Not on file   Years of education: Not on file   Highest education level: Not on file  Occupational History   Not on file  Tobacco Use   Smoking status: Every Day   Smokeless tobacco: Never  Substance and Sexual Activity   Alcohol use: Yes    Alcohol/week: 3.0 standard drinks of alcohol    Types: 3 Standard drinks or equivalent per week   Drug use: No   Sexual activity: Not on file  Other Topics Concern  Not on file  Social History Narrative   Not on file   Social Determinants of Health   Financial Resource Strain: Low Risk  (04/28/2022)   Overall Financial Resource Strain (CARDIA)    Difficulty of Paying Living Expenses: Not hard at all  Food Insecurity: No Food Insecurity (04/28/2022)   Hunger Vital Sign    Worried About Running Out of Food in the Last Year: Never true    Ran Out of Food in the Last Year: Never true  Transportation Needs: No Transportation Needs (04/28/2022)   PRAPARE - Hydrologist (Medical): No    Lack of Transportation (Non-Medical): No  Physical Activity: Inactive (04/28/2022)   Exercise Vital Sign    Days of Exercise per Week: 0 days    Minutes of Exercise per Session: 0 min  Stress: No Stress Concern Present (04/28/2022)   Banks    Feeling of Stress : Only a little  Social Connections: Socially Isolated (04/28/2022)   Social Connection and Isolation Panel [NHANES]    Frequency of Communication with Friends and Family: Twice a week    Frequency of Social Gatherings with Friends and Family: Once a week    Attends Religious Services: Never    Marine scientist or Organizations: No    Attends Archivist Meetings: Never    Marital Status: Widowed    Tobacco Counseling Ready to quit: Not Answered Counseling given: Not Answered   Clinical Intake:  Pre-visit preparation completed: Yes  Pain : 0-10 Pain Score: 3  Pain Location: Teeth     Diabetes: No CBG done?: No Did pt. bring in CBG monitor from home?: No  How often do you need to have someone help you when you read instructions, pamphlets, or other written materials from your doctor or pharmacy?: 1 - Never  Diabetic?pt says no  Interpreter Needed?: No  Information entered by :: Kirke Shaggy, LPN   Activities of Daily Living    04/28/2022    1:40 PM  In your present state of health, do you have any difficulty performing the following activities:  Hearing? 0  Vision? 0  Difficulty concentrating or making decisions? 0  Walking or climbing stairs? 0  Dressing or bathing? 0  Doing errands, shopping? 0  Preparing Food and eating ? N  Using the Toilet? N  In the past six months, have you accidently leaked urine? N  Do you have problems with loss of bowel control? N  Managing your Medications? N  Managing your Finances? N  Housekeeping or managing your Housekeeping? N    Patient Care Team: Olin Hauser, DO as PCP - General (Family Medicine)  Indicate any recent Medical Services you may have received from other than [facility] providers in the past year (date may be approximate).     Assessment:   This is a routine wellness examination for [facility].  Hearing/Vision screen Hearing  Screening - Comments:: No aids Vision Screening - Comments:: No glasses- Dr.Woodard  Dietary issues and exercise activities discussed: Current Exercise Habits: The patient does not participate in regular exercise at present   Goals Addressed             This Visit's Progress    DIET - EAT MORE FRUITS AND VEGETABLES         Depression Screen    04/28/2022    1:36 PM  PHQ 2/9 Scores  PHQ -  2 Score 0  PHQ- 9 Score 0    Fall Risk    04/28/2022    1:40 PM  Fall Risk   Falls in the past year? 0  Number falls in past yr: 0  Injury with Fall? 0  Risk for fall due to : No Fall Risks  Follow up Falls prevention discussed;Falls evaluation completed    FALL RISK PREVENTION PERTAINING TO THE HOME:  Any stairs in or around the home? No  If so, are there any without handrails? No  Home free of loose throw rugs in walkways, pet beds, electrical cords, etc? Yes  Adequate lighting in your home to reduce risk of falls? Yes   ASSISTIVE DEVICES UTILIZED TO PREVENT FALLS:  Life alert? No  Use of a cane, walker or w/c? No  Grab bars in the bathroom? No  Shower chair or bench in shower? Yes  Elevated toilet seat or a handicapped toilet? No   TIMED UP AND GO:  Was the test performed? Yes .  Length of time to ambulate 10 feet: 4 sec.   Gait steady and fast without use of assistive device  Cognitive Function:        04/28/2022    1:44 PM  6CIT Screen  What Year? 0 points  What month? 0 points  What time? 0 points  Count back from 20 0 points  Months in reverse 0 points  Repeat phrase 2 points  Total Score 2 points    Immunizations Immunization History  Administered Date(s) Administered   COVID-19, mRNA, vaccine(Comirnaty)12 years and older 04/12/2022   Fluad Quad(high Dose 65+) 03/14/2022   Influenza Inj Mdck Quad Pf 03/05/2020   Influenza, High Dose Seasonal PF 02/25/2021   Influenza, Seasonal, Injecte, Preservative Fre 03/04/2010, 01/27/2013    Influenza,inj,Quad PF,6+ Mos 02/10/2014, 03/03/2015, 03/02/2016, 03/19/2017, 03/18/2018, 03/26/2019   PFIZER(Purple Top)SARS-COV-2 Vaccination 07/10/2019, 08/05/2019, 02/10/2020   Pneumococcal Conjugate-13 09/01/2014   Pneumococcal Polysaccharide-23 05/15/2004, 08/27/2013   Rsv, Bivalent, Protein Subunit Rsvpref,pf Evans Lance) 04/24/2022   Tdap 01/14/2008   Zoster Recombinat (Shingrix) 12/06/2021    TDAP status: Due, Education has been provided regarding the importance of this vaccine. Advised may receive this vaccine at local pharmacy or Health Dept. Aware to provide a copy of the vaccination record if obtained from local pharmacy or Health Dept. Verbalized acceptance and understanding.  Flu Vaccine status: Up to date  Pneumococcal vaccine status: Up to date  Covid-19 vaccine status: Completed vaccines  Qualifies for Shingles Vaccine? Yes   Zostavax completed No   Shingrix Completed?: No.    Education has been provided regarding the importance of this vaccine. Patient has been advised to call insurance company to determine out of pocket expense if they have not yet received this vaccine. Advised may also receive vaccine at local pharmacy or Health Dept. Verbalized acceptance and understanding.  Screening Tests Health Maintenance  Topic Date Due   Diabetic kidney evaluation - Urine ACR  Never done   Lung Cancer Screening  11/25/2008   DTaP/Tdap/Td (2 - Td or Tdap) 01/13/2018   Zoster Vaccines- Shingrix (2 of 2) 01/31/2022   HEMOGLOBIN A1C  06/01/2022   COVID-19 Vaccine (5 - 2023-24 season) 06/07/2022   Diabetic kidney evaluation - eGFR measurement  11/30/2022   FOOT EXAM  12/07/2022   OPHTHALMOLOGY EXAM  12/10/2022   Medicare Annual Wellness (AWV)  04/29/2023   COLONOSCOPY (Pts 45-70yr Insurance coverage will need to be confirmed)  07/27/2025   Pneumonia Vaccine 75 Years old  Completed   INFLUENZA VACCINE  Completed   Hepatitis C Screening  Completed   HPV VACCINES  Aged Out     Health Maintenance  Health Maintenance Due  Topic Date Due   Diabetic kidney evaluation - Urine ACR  Never done   Lung Cancer Screening  11/25/2008   DTaP/Tdap/Td (2 - Td or Tdap) 01/13/2018   Zoster Vaccines- Shingrix (2 of 2) 01/31/2022    Colorectal cancer screening: Type of screening: Colonoscopy. Completed 07/28/15. Repeat every 10 years  Lung Cancer Screening: (Low Dose CT Chest recommended if Age 58-80 years, 30 pack-year currently smoking OR have quit w/in 15years.) does not qualify.   Lung Cancer Screening Referral: declined referral  Additional Screening:  Hepatitis C Screening: does qualify; Completed 09/01/15  Vision Screening: Recommended annual ophthalmology exams for early detection of glaucoma and other disorders of the eye. Is the patient up to date with their annual eye exam?  Yes  Who is the provider or what is the name of the office in which the patient attends annual eye exams? Dr.Woodard If pt is not established with a provider, would they like to be referred to a provider to establish care? No .   Dental Screening: Recommended annual dental exams for proper oral hygiene  Community Resource Referral / Chronic Care Management: CRR required this visit?  No   CCM required this visit?  No      Plan:     I have personally reviewed and noted the following in the patient's chart:   Medical and social history Use of alcohol, tobacco or illicit drugs  Current medications and supplements including opioid prescriptions. Patient is not currently taking opioid prescriptions. Functional ability and status Nutritional status Physical activity Advanced directives List of other physicians Hospitalizations, surgeries, and ER visits in previous 12 months Vitals Screenings to include cognitive, depression, and falls Referrals and appointments  In addition, I have reviewed and discussed with patient certain preventive protocols, quality metrics, and best  practice recommendations. A written personalized care plan for preventive services as well as general preventive health recommendations were provided to patient.     Dionisio David, LPN   [id number]   Nurse Notes: none

## 2022-06-09 ENCOUNTER — Ambulatory Visit (INDEPENDENT_AMBULATORY_CARE_PROVIDER_SITE_OTHER): Payer: No Typology Code available for payment source | Admitting: Family Medicine

## 2022-06-09 ENCOUNTER — Encounter: Payer: Self-pay | Admitting: Family Medicine

## 2022-06-09 ENCOUNTER — Other Ambulatory Visit: Payer: Self-pay | Admitting: Family Medicine

## 2022-06-09 VITALS — BP 118/60 | HR 80 | Ht 72.0 in | Wt 217.6 lb

## 2022-06-09 DIAGNOSIS — N401 Enlarged prostate with lower urinary tract symptoms: Secondary | ICD-10-CM

## 2022-06-09 DIAGNOSIS — Z23 Encounter for immunization: Secondary | ICD-10-CM

## 2022-06-09 DIAGNOSIS — E1159 Type 2 diabetes mellitus with other circulatory complications: Secondary | ICD-10-CM | POA: Diagnosis not present

## 2022-06-09 DIAGNOSIS — I739 Peripheral vascular disease, unspecified: Secondary | ICD-10-CM

## 2022-06-09 DIAGNOSIS — E785 Hyperlipidemia, unspecified: Secondary | ICD-10-CM

## 2022-06-09 DIAGNOSIS — K219 Gastro-esophageal reflux disease without esophagitis: Secondary | ICD-10-CM

## 2022-06-09 DIAGNOSIS — E1169 Type 2 diabetes mellitus with other specified complication: Secondary | ICD-10-CM

## 2022-06-09 DIAGNOSIS — I10 Essential (primary) hypertension: Secondary | ICD-10-CM

## 2022-06-09 DIAGNOSIS — Z0000 Encounter for general adult medical examination without abnormal findings: Secondary | ICD-10-CM

## 2022-06-09 LAB — POCT GLYCOSYLATED HEMOGLOBIN (HGB A1C): Hemoglobin A1C: 8.5 % — AB (ref 4.0–5.6)

## 2022-06-09 MED ORDER — PANTOPRAZOLE SODIUM 20 MG PO TBEC: 20.0000 mg | DELAYED_RELEASE_TABLET | Freq: Every day | ORAL | 1 refills | Status: DC

## 2022-06-09 NOTE — Assessment & Plan Note (Signed)
Well-controlled DM with A1c 8.5, previous 7.7 Keep improving, he is diet controlled Complications - history of stroke, PAD  Plan:  1. Discuss future management if elevated A1c or hyperglycemia 2. Goal to focus on diet now - Encourage improved lifestyle - low carb, low sugar diet, reduce portion size, continue improving regular exercise 3. Check CBG , bring log to next visit for review 4. Continue ASA, ACEi, Statin

## 2022-06-09 NOTE — Assessment & Plan Note (Signed)
History of GERD / PUD Has improved on PPI Discussion today on tapering down, will reduce Pantoprazole from 40 to '20mg'$  daily, new rx sent not ready for fill yet still has 3 months of '40mg'$ . Goal to taper off if can tolerate this reduction. Insurance not able to pay for long term PPI

## 2022-06-09 NOTE — Progress Notes (Signed)
Subjective:    Patient ID: [name], male    DOB: [birth year], 77 y.o.   MRN: [id number]  [name] is a 77 y.o. male presenting on 06/09/2022 for Diabetes   HPI   CHRONIC DM, Type 2: Last A1c 7 range, now A1c due today Attributed to ice cream and sweets. he will reduce sweet ice tea and sodas Reports controlled w/ lifestyle CBGs: Not checking regularly Meds: None. Currently on ACEi Denies hypoglycemia, polyuria, visual changes, numbness or tingling.   GERD / History PUD On Pantoprazole '40mg'$  daily, he says no problem with medicine, insurance asks about coverage as they will no longer cover in future. He has 3 month left Interested in lower dose.  BPH, w LUTS He wears adult diaper Uses OTC Saw Palmetto TID, limited relief No longer w/ [facility]rology On Tamsulosin 0.'4mg'$  daily with some help   HYPERLIPIDEMIA: - Reports no concerns. 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    Health Maintenance:  Due shingrix #2 today vaccine     04/28/2022    1:36 PM  Depression screen PHQ 2/9  Decreased Interest 0  Down, Depressed, Hopeless 0  PHQ - 2 Score 0  Altered sleeping 0  Tired, decreased energy 0  Change in appetite 0  Feeling bad or failure about yourself  0  Trouble concentrating 0  Moving slowly or fidgety/restless 0  Suicidal thoughts 0  PHQ-9 Score 0  Difficult doing work/chores Not difficult at all    Social History   Tobacco Use   Smoking status: Every Day   Smokeless tobacco: Never  Substance Use Topics   Alcohol use: Yes    Alcohol/week: 3.0 standard drinks of alcohol    Types: 3 Standard drinks or equivalent per week   Drug  use: No    Review of Systems Per HPI unless specifically indicated above     Objective:    BP 118/60   Pulse 80   Ht 6' (1.829 m)   Wt 217 lb 9.6 oz (98.7 kg)   SpO2 95%   BMI 29.51 kg/m   Wt Readings from Last 3 Encounters:  06/09/22 217 lb 9.6 oz (98.7 kg)  04/28/22 220 lb 3.2 oz (99.9 kg)  12/06/21 221 lb 9.6 oz (100.5 kg)    Physical Exam Vitals and nursing note reviewed.  Constitutional:      General: He is not in acute distress.    Appearance: He is well-developed. He is obese. He is not diaphoretic.     Comments: Well-appearing, comfortable, cooperative  HENT:     Head: Normocephalic and atraumatic.  Eyes:     General:        Right eye: No discharge.        Left eye: No discharge.     Conjunctiva/sclera: Conjunctivae normal.  Neck:     Thyroid: No thyromegaly.  Cardiovascular:     Rate and Rhythm: Normal rate and regular rhythm.     Pulses: Normal pulses.     Heart sounds: Normal heart sounds. No murmur heard. Pulmonary:     Effort: Pulmonary effort is normal. No respiratory distress.  Breath sounds: Normal breath sounds. No wheezing or rales.  Musculoskeletal:        General: Normal range of motion.     Cervical back: Normal range of motion and neck supple.  Lymphadenopathy:     Cervical: No cervical adenopathy.  Skin:    General: Skin is warm and dry.     Findings: No erythema or rash.  Neurological:     Mental Status: He is alert and oriented to person, place, and time. Mental status is at baseline.  Psychiatric:        Behavior: Behavior normal.     Comments: Well groomed, good eye contact, normal speech and thoughts     Results for orders placed or performed in visit on 06/09/22  POCT glycosylated hemoglobin (Hb A1C)  Result Value Ref Range   Hemoglobin A1C 8.5 (A) 4.0 - 5.6 %    Recent Labs    11/29/21 0754 06/09/22 0822  HGBA1C 7.7* 8.5*       Assessment & Plan:   Problem List Items Addressed This Visit     Gastroesophageal  reflux disease without esophagitis    History of GERD / PUD Has improved on PPI Discussion today on tapering down, will reduce Pantoprazole from 40 to '20mg'$  daily, new rx sent not ready for fill yet still has 3 months of '40mg'$ . Goal to taper off if can tolerate this reduction. Insurance not able to pay for long term PPI      Relevant Medications   pantoprazole (PROTONIX) 20 MG tablet   Type 2 diabetes mellitus with other circulatory complications (HCC) - Primary    Well-controlled DM with A1c 8.5, previous 7.7 Keep improving, he is diet controlled Complications - history of stroke, PAD  Plan:  1. Discuss future management if elevated A1c or hyperglycemia 2. Goal to focus on diet now - Encourage improved lifestyle - low carb, low sugar diet, reduce portion size, continue improving regular exercise 3. Check CBG , bring log to next visit for review 4. Continue ASA, ACEi, Statin      Relevant Orders   POCT glycosylated hemoglobin (Hb A1C) (Completed)   Other Visit Diagnoses     Need for shingles vaccine       Relevant Orders   Varicella-zoster vaccine IM (Completed)         Meds ordered this encounter  Medications   pantoprazole (PROTONIX) 20 MG tablet    Sig: Take 1 tablet (20 mg total) by mouth daily before breakfast.    Dispense:  90 tablet    Refill:  1    Please do not fill lower dose yet. '20mg'$  new rx is for the NEXT FILL to replace existing '40mg'$ . Goal is to taper down.     Follow up plan: Return in about 6 months (around 12/08/2022) for 6 month fasting lab only + urine test then 1 week later Annual Physical.  Future labs ordered for 12/08/22 Blood + Urine microalbumin  Nobie Putnam, DO [facility] 06/09/2022, 8:19 AM

## 2022-06-09 NOTE — Addendum Note (Signed)
Addended by: Olin Hauser on: 06/09/2022 10:06 AM   Modules accepted: Orders

## 2022-06-09 NOTE — Patient Instructions (Addendum)
Thank you for coming to the office today.  Next time will do blood draw and urine test.  Next time you are due for a refill on the Stomach Acid / Ulcer medication - Pantoprazole '40mg'$ , the goal is to reduce this down to '20mg'$  - with new lower dose rx.  Recent Labs    11/29/21 0754 06/09/22 0822  HGBA1C 7.7* 8.5*   Elevated A1c, goal to try to limit excess carb starches sugars. Try to improve diet to control A1c sugar.  Diet Recommendations for Treating Diabetes   REDUCE Starchy (carb) foods include: Bread, rice, pasta, potatoes, corn, crackers, bagels, muffins, all baked goods.   FRUITS - LIMIT these HIGH sugar/carb fruits = Pineapple, Watermelon, Bananas - OKAY with these MEDIUM sugar/carb fruits = Citrus, Oranges, Grapes - PREFER these LOW sugar/carb fruits = Apples, Berries, Pears, Plums  Protein foods include: Meat, fish, poultry, eggs, dairy foods, and beans such as pinto and kidney beans (beans also provide carbohydrate).   1. Eat at least 3 meals and 1-2 snacks per day. Never go more than 4-5 hours while awake without eating.   2. Limit starchy foods to TWO per meal and ONE per snack. ONE portion of a starchy  food is equal to the following:   - ONE slice of bread (or its equivalent, such as half of a hamburger bun).   - 1/2 cup of a "scoopable" starchy food such as potatoes or rice.   - 1 OUNCE (28 grams) of starchy snacks (crackers or pretzels, look on label).   - 15 grams of carbohydrate as shown on food label.   3. Both lunch and dinner should include a protein food, a carb food, and vegetables.   - Obtain twice as many veg's as protein or carbohydrate foods for both lunch and dinner.   - Try to keep frozen veg's on hand for a quick vegetable serving.     - Fresh or frozen veg's are best.   4. Breakfast should always include protein.    DUE for FASTING BLOOD WORK (no food or drink after midnight before the lab appointment, only water or coffee without cream/sugar on  the morning of)  SCHEDULE "Lab Only" visit in the morning at the clinic for lab draw in 6 MONTHS   - Make sure Lab Only appointment is at about 1 week before your next appointment, so that results will be available  For Lab Results, once available within 2-3 days of blood draw, you can can log in to MyChart online to view your results and a brief explanation. Also, we can discuss results at next follow-up visit.   Please schedule a Follow-up Appointment to: Return in about 6 months (around 12/08/2022) for 6 month fasting lab only + urine test then 1 week later Annual Physical.  If you have any other questions or concerns, please feel free to call the office or send a message through [facility]. You may also schedule an earlier appointment if necessary.  Additionally, you may be receiving a survey about your experience at our office within a few days to 1 week by e-mail or mail. We value your feedback.  Nobie Putnam, DO [facility], [facility]  Diabetes Mellitus and Nutrition, Adult When you have diabetes, or diabetes mellitus, it is very important to have healthy eating habits because your blood sugar (glucose) levels are greatly affected by what you eat and drink. Eating healthy foods in the right amounts, at about the same times  every day, can help you: Manage your blood glucose. Lower your risk of heart disease. Improve your blood pressure. Reach or maintain a healthy weight. What can affect my meal plan? Every person with diabetes is different, and each person has different needs for a meal plan. Your health care provider may recommend that you work with a dietitian to make a meal plan that is best for you. Your meal plan may vary depending on factors such as: The calories you need. The medicines you take. Your weight. Your blood glucose, blood pressure, and cholesterol levels. Your activity level. Other health conditions you have, such as heart or kidney  disease. How do carbohydrates affect me? Carbohydrates, also called carbs, affect your blood glucose level more than any other type of food. Eating carbs raises the amount of glucose in your blood. It is important to know how many carbs you can safely have in each meal. This is different for every person. Your dietitian can help you calculate how many carbs you should have at each meal and for each snack. How does alcohol affect me? Alcohol can cause a decrease in blood glucose (hypoglycemia), especially if you use insulin or take certain diabetes medicines by mouth. Hypoglycemia can be a life-threatening condition. Symptoms of hypoglycemia, such as sleepiness, dizziness, and confusion, are similar to symptoms of having too much alcohol. Do not drink alcohol if: Your health care provider tells you not to drink. You are pregnant, may be pregnant, or are planning to become pregnant. If you drink alcohol: Limit how much you have to: 0-1 drink a day for women. 0-2 drinks a day for men. Know how much alcohol is in your drink. In the [place] one drink equals one 12 oz bottle of beer (355 mL), one 5 oz glass of wine (148 mL), or one 1 oz glass of hard liquor (44 mL). Keep yourself hydrated with water, diet soda, or unsweetened iced tea. Keep in mind that regular soda, juice, and other mixers may contain a lot of sugar and must be counted as carbs. What are tips for following this plan?  Reading food labels Start by checking the serving size on the Nutrition Facts label of packaged foods and drinks. The number of calories and the amount of carbs, fats, and other nutrients listed on the label are based on one serving of the item. Many items contain more than one serving per package. Check the total grams (g) of carbs in one serving. Check the number of grams of saturated fats and trans fats in one serving. Choose foods that have a low amount or none of these fats. Check the number of milligrams (mg) of  salt (sodium) in one serving. Most people should limit total sodium intake to less than 2,300 mg per day. Always check the nutrition information of foods labeled as "low-fat" or "nonfat." These foods may be higher in added sugar or refined carbs and should be avoided. Talk to your dietitian to identify your daily goals for nutrients listed on the label. Shopping Avoid buying canned, pre-made, or processed foods. These foods tend to be high in fat, sodium, and added sugar. Shop around the outside edge of the grocery store. This is where you will most often find fresh fruits and vegetables, bulk grains, fresh meats, and fresh dairy products. Cooking Use low-heat cooking methods, such as baking, instead of high-heat cooking methods, such as deep frying. Cook using healthy oils, such as olive, canola, or sunflower oil. Avoid cooking with  butter, cream, or high-fat meats. Meal planning Eat meals and snacks regularly, preferably at the same times every day. Avoid going long periods of time without eating. Eat foods that are high in fiber, such as fresh fruits, vegetables, beans, and whole grains. Eat 4-6 oz (112-168 g) of lean protein each day, such as lean meat, chicken, fish, eggs, or tofu. One ounce (oz) (28 g) of lean protein is equal to: 1 oz (28 g) of meat, chicken, or fish. 1 egg.  cup (62 g) of tofu. Eat some foods each day that contain healthy fats, such as avocado, nuts, seeds, and fish. What foods should I eat? Fruits Berries. Apples. Oranges. Peaches. Apricots. Plums. Grapes. Mangoes. Papayas. Pomegranates. Kiwi. Cherries. Vegetables Leafy greens, including lettuce, spinach, kale, chard, collard greens, mustard greens, and cabbage. Beets. Cauliflower. Broccoli. Carrots. Green beans. Tomatoes. Peppers. Onions. Cucumbers. Brussels sprouts. Grains Whole grains, such as whole-wheat or whole-grain bread, crackers, tortillas, cereal, and pasta. Unsweetened oatmeal. Quinoa. Brown or wild  rice. Meats and other proteins Seafood. Poultry without skin. Lean cuts of poultry and beef. Tofu. Nuts. Seeds. Dairy Low-fat or fat-free dairy products such as milk, yogurt, and cheese. The items listed above may not be a complete list of foods and beverages you can eat and drink. Contact a dietitian for more information. What foods should I avoid? Fruits Fruits canned with syrup. Vegetables Canned vegetables. Frozen vegetables with butter or cream sauce. Grains Refined white flour and flour products such as bread, pasta, snack foods, and cereals. Avoid all processed foods. Meats and other proteins Fatty cuts of meat. Poultry with skin. Breaded or fried meats. Processed meat. Avoid saturated fats. Dairy Full-fat yogurt, cheese, or milk. Beverages Sweetened drinks, such as soda or iced tea. The items listed above may not be a complete list of foods and beverages you should avoid. Contact a dietitian for more information. Questions to ask a health care provider Do I need to meet with a certified diabetes care and education specialist? Do I need to meet with a dietitian? What number can I call if I have questions? When are the best times to check my blood glucose? Where to find more information: American Diabetes Association: diabetes.org Academy of Nutrition and Dietetics: eatright.[employer] of Diabetes and Digestive and Kidney Diseases: AmenCredit.is Association of Diabetes Care & Education Specialists: diabeteseducator.org Summary It is important to have healthy eating habits because your blood sugar (glucose) levels are greatly affected by what you eat and drink. It is important to use alcohol carefully. A healthy meal plan will help you manage your blood glucose and lower your risk of heart disease. Your health care provider may recommend that you work with a dietitian to make a meal plan that is best for you. This information is not intended to replace advice given  to you by your health care provider. Make sure you discuss any questions you have with your health care provider. Document Revised: 12/03/2019 Document Reviewed: 12/03/2019 Elsevier Patient Education  [place].

## 2022-09-28 ENCOUNTER — Other Ambulatory Visit: Payer: Self-pay | Admitting: Family Medicine

## 2022-09-28 DIAGNOSIS — E1159 Type 2 diabetes mellitus with other circulatory complications: Secondary | ICD-10-CM

## 2022-09-28 DIAGNOSIS — N401 Enlarged prostate with lower urinary tract symptoms: Secondary | ICD-10-CM

## 2022-09-28 DIAGNOSIS — E1169 Type 2 diabetes mellitus with other specified complication: Secondary | ICD-10-CM

## 2022-09-29 NOTE — Telephone Encounter (Signed)
Requested medication (s) are due for refill today:   Yes for all 3  Requested medication (s) are on the active medication list:   Yes for all 3  Future visit scheduled:   Yes 12/15/2022    Last ordered: 12/14/2021 #90, 3 refills for all 3.  Returned because unable to refill due to labs being due.   Provider to review for refills prior to upcoming appt.   Requested Prescriptions  Pending Prescriptions Disp Refills   simvastatin (ZOCOR) 40 MG tablet [Pharmacy Med Name: SIMVASTATIN  TAB 40MG ] 90 tablet 3    Sig: TAKE 1 TABLET DAILY     Cardiovascular:  Antilipid - Statins Failed - 09/28/2022  6:32 PM      Failed - Lipid Panel in normal range within the last 12 months    Cholesterol  Date Value Ref Range Status  11/29/2021 148 <200 mg/dL Final  16/02/9603 540 0 - 200 mg/dL Final   Ldl Cholesterol, Calc  Date Value Ref Range Status  04/21/2012 47 0 - 100 mg/dL Final   LDL Cholesterol (Calc)  Date Value Ref Range Status  11/29/2021 85 mg/dL (calc) Final    Comment:    Reference range: <100 . Desirable range <100 mg/dL for primary prevention;   <70 mg/dL for patients with CHD or diabetic patients  with > or = 2 CHD risk factors. Marland Kitchen LDL-C is now calculated using the Martin-Hopkins  calculation, which is a validated novel method providing  better accuracy than the Friedewald equation in the  estimation of LDL-C.  Horald Pollen et al. Lenox Ahr. 9811;914(78): 2061-2068  (http://education.QuestDiagnostics.com/faq/FAQ164)    HDL Cholesterol  Date Value Ref Range Status  04/21/2012 18 (L) 40 - 60 mg/dL Final   HDL  Date Value Ref Range Status  11/29/2021 38 (L) > OR = 40 mg/dL Final   Triglycerides  Date Value Ref Range Status  11/29/2021 156 (H) <150 mg/dL Final  29/56/2130 865 (H) 0 - 200 mg/dL Final         Passed - Patient is not pregnant      Passed - Valid encounter within last 12 months    Recent Outpatient Visits           3 months ago Type 2 diabetes mellitus with  other circulatory complications [facility])   [place] [facility] [place], Netta Neat, DO   9 months ago Annual physical exam   [place] [facility] Smitty Cords, DO   12 months ago Hyperlipidemia associated with type 2 diabetes mellitus [facility])   [place] [facility] Althea Charon, Netta Neat, DO       Future Appointments             In 2 months Althea Charon, Netta Neat, DO [place] [facility]             lisinopril (ZESTRIL) 10 MG tablet [Pharmacy Med Name: LISINOPRIL TAB 10MG ] 90 tablet 3    Sig: TAKE 1 TABLET DAILY     Cardiovascular:  ACE Inhibitors Failed - 09/28/2022  6:32 PM      Failed - Cr in normal range and within 180 days    Creat  Date Value Ref Range Status  11/29/2021 0.67 (L) 0.70 - 1.28 mg/dL Final         Failed - K in normal range and within 180 days    Potassium  Date Value Ref Range Status  11/29/2021 4.2  3.5 - 5.3 mmol/L Final  05/29/2012 4.3 3.5 - 5.1 mmol/L Final         Passed - Patient is not pregnant      Passed - Last BP in normal range    BP Readings from Last 1 Encounters:  06/09/22 118/60         Passed - Valid encounter within last 6 months    Recent Outpatient Visits           3 months ago Type 2 diabetes mellitus with other circulatory complications [facility])   [place] [facility] [place], Netta Neat, DO   9 months ago Annual physical exam   [place] [facility] Smitty Cords, DO   12 months ago Hyperlipidemia associated with type 2 diabetes mellitus [facility])   [place] [facility] Althea Charon, Netta Neat, DO       Future Appointments             In 2 months Althea Charon, Netta Neat, DO [place] [facility], [facility]             tamsulosin (FLOMAX) 0.4 MG CAPS capsule [Pharmacy Med Name: TAMSULOSIN CAP 0.4MG ] 90 capsule 3    Sig: TAKE 1 CAPSULE  DAILY AFTER BREAKFAST     Urology: Alpha-Adrenergic Blocker Passed - 09/28/2022  6:32 PM      Passed - PSA in normal range and within 360 days    PSA  Date Value Ref Range Status  11/29/2021 2.32 < OR = 4.00 ng/mL Final    Comment:    The total PSA value from this assay system is  standardized against the WHO standard. The test  result will be approximately 20% lower when compared  to the equimolar-standardized total PSA (Beckman  Coulter). Comparison of serial PSA results should be  interpreted with this fact in mind. . This test was performed using the Siemens  chemiluminescent method. Values obtained from  different assay methods cannot be used interchangeably. PSA levels, regardless of value, should not be interpreted as absolute evidence of the presence or absence of disease.    Prostate Specific Ag, Serum  Date Value Ref Range Status  10/30/2016 2.3 0.0 - 4.0 ng/mL Final    Comment:    Roche ECLIA methodology. According to the American Urological Association, Serum PSA should decrease and remain at undetectable levels after radical prostatectomy. The AUA defines biochemical recurrence as an initial PSA value 0.2 ng/mL or greater followed by a subsequent confirmatory PSA value 0.2 ng/mL or greater. Values obtained with different assay methods or kits cannot be used interchangeably. Results cannot be interpreted as absolute evidence of the presence or absence of malignant disease.          Passed - Last BP in normal range    BP Readings from Last 1 Encounters:  06/09/22 118/60         Passed - Valid encounter within last 12 months    Recent Outpatient Visits           3 months ago Type 2 diabetes mellitus with other circulatory complications [facility])   [place] [facility] [place], Netta Neat, DO   9 months ago Annual physical exam   [place] [facility] Smitty Cords, DO   12 months ago Hyperlipidemia  associated with type 2 diabetes mellitus [facility])    [facility] [place], Netta Neat, [place]  Future Appointments             In 2 months Karamalegos, Netta Neat, DO [place] [facility], [facility]

## 2022-11-07 ENCOUNTER — Ambulatory Visit (INDEPENDENT_AMBULATORY_CARE_PROVIDER_SITE_OTHER): Payer: No Typology Code available for payment source | Admitting: Internal Medicine

## 2022-11-07 ENCOUNTER — Encounter: Payer: Self-pay | Admitting: Internal Medicine

## 2022-11-07 ENCOUNTER — Ambulatory Visit: Payer: Self-pay

## 2022-11-07 VITALS — BP 118/62 | HR 96 | Temp 96.6°F | Wt 195.0 lb

## 2022-11-07 DIAGNOSIS — J329 Chronic sinusitis, unspecified: Secondary | ICD-10-CM

## 2022-11-07 DIAGNOSIS — B9789 Other viral agents as the cause of diseases classified elsewhere: Secondary | ICD-10-CM | POA: Diagnosis not present

## 2022-11-07 MED ORDER — PREDNISONE 10 MG PO TABS: ORAL_TABLET | ORAL | 0 refills | Status: DC

## 2022-11-07 NOTE — Telephone Encounter (Signed)
    Chief Complaint: Having headaches that hurt in face and top left of head. Also having sinus symptoms. Symptoms: Above Frequency: 1 week ago Pertinent Negatives: Patient denies any weakness or balance issues. Disposition: [] ED /[] Urgent Care (no appt availability in office) / [x] Appointment(In office/virtual)/ []  [place] [facility]/ [] Home Care/ [] Refused Recommended Disposition /[] [place] [facility]/ []  Follow-up with PCP Additional Notes: Pt. Agrees with disposition.  Reason for Disposition  [1] SEVERE headache (e.g., excruciating) AND [2] not improved after 2 hours of pain medicine  Answer Assessment - Initial Assessment Questions 1. LOCATION: "Where does it hurt?"      Face and top left 2. ONSET: "When did the headache start?" (Minutes, hours or days)      1 week ago 3. PATTERN: "Does the pain come and go, or has it been constant since it started?"     2 day  4. SEVERITY: "How bad is the pain?" and "What does it keep you from doing?"  (e.g., Scale 1-10; mild, moderate, or severe)   - MILD (1-3): doesn't interfere with normal activities    - MODERATE (4-7): interferes with normal activities or awakens from sleep    - SEVERE (8-10): excruciating pain, unable to do any normal activities        Severe 5. RECURRENT SYMPTOM: "Have you ever had headaches before?" If Yes, ask: "When was the last time?" and "What happened that time?"      Years ago 6. CAUSE: "What do you think is causing the headache?"     Maybe sinus 7. MIGRAINE: "Have you been diagnosed with migraine headaches?" If Yes, ask: "Is this headache similar?"      No 8. HEAD INJURY: "Has there been any recent injury to the head?"      No 9. OTHER SYMPTOMS: "Do you have any other symptoms?" (fever, stiff neck, eye pain, sore throat, cold symptoms)     No 10. PREGNANCY: "Is there any chance you are pregnant?" "When was your last menstrual period?"       N/a  Protocols used: Headache-A-AH

## 2022-11-07 NOTE — Patient Instructions (Signed)
Sinus Pain  Sinus pain may occur when your sinuses become clogged or swollen. Sinuses are air-filled spaces in your skull that are behind the bones of your face and forehead. Sinus pain can range from mild to severe. What are the causes? Sinus pain can result from various conditions that affect the sinuses. Common causes include: Colds. Sinus infections. Allergies. What are the signs or symptoms? The main symptom of this condition is pain or pressure in your face, forehead, ears, or upper teeth. People who have sinus pain often have other symptoms, such as: Congested or runny nose. Fever. Inability to smell. Headache. Weather changes can make symptoms worse. How is this diagnosed? Your health care provider will diagnose this condition based on your symptoms and a physical exam. If you have pain that keeps coming back or does not go away, your health care provider may recommend more testing. This may include: Imaging tests, such as a CT scan or MRI, to check for problems with your sinuses. Examination of your sinuses using a thin tool with a camera that is inserted through your nose (endoscopy). How is this treated? Treatment for this condition depends on the cause. Sinus pain that is caused by a sinus infection may be treated with antibiotic medicine. Sinus pain that is caused by congestion may be helped by rinsing out (flushing) the nose and sinuses with saline solution. Sinus pain that is caused by allergies may be helped by allergy medicines (antihistamines) and medicated nasal sprays. Sinus surgery may be needed in some cases if other treatments do not help. Follow these instructions at home: General instructions If directed: Apply a warm, moist washcloth to your face to help relieve pain. Use a nasal saline wash. Follow the directions on the bottle or box. Hydrate and humidify Drink enough water to keep your urine clear or pale yellow. Staying hydrated will help to thin your  mucus. Use a humidifier if your home is dry. Inhale steam for 10-15 minutes, 3-4 times a day or as told by your health care provider. You can do this in the bathroom while a hot shower is running. Limit your exposure to cool or dry air. Medicines  Take over-the-counter and prescription medicines only as told by your health care provider. If you were prescribed an antibiotic medicine, take it as told by your health care provider. Do not stop taking the antibiotic even if you start to feel better. If you have congestion, use a nasal spray to help lessen pressure. Contact a health care provider if: You have sinus pain more than one time a week. You have sensitivity to light or sound. You develop a fever. You feel nauseous or you vomit. Your sinus pain or headache does not get better with treatment. Get help right away if: You have vision problems. You have sudden, severe pain in your face or head. You have a seizure. You are confused. You have a stiff neck. Summary Sinus pain occurs when your sinuses become clogged or swollen. Sinus pain can result from various conditions that affect the sinuses, such as a cold, a sinus infection, or an allergy. Treatment for this condition depends on the cause. It may include medicine, such as antibiotics or antihistamines. This information is not intended to replace advice given to you by your health care provider. Make sure you discuss any questions you have with your health care provider. Document Revised: 04/03/2021 Document Reviewed: 04/03/2021 Elsevier Patient Education  2024 [employer].

## 2022-11-07 NOTE — Progress Notes (Signed)
HPI  Patient presents to clinic today with complaint of headache and facial pain/pressure.  This started 1 week ago. The headache is located in the left cheek and radiates into the left side of his head. He denies vision changes or dizziness. He denies runny nose, ear pain, sore throat, cough or shortness of breath. He denies nausea, vomiting or diarrhea. He denies fever, chills or body aches.  He has tried claritin, excedrin with some relief of symptoms. He has not had sick contacts that he is aware of.  Review of Systems     Past Medical History:  Diagnosis Date   COPD (chronic obstructive pulmonary disease) (HCC)    Coronary artery disease    Diabetes mellitus without complication (HCC)    Gastric ulcer with hemorrhage    GERD (gastroesophageal reflux disease)    Headache    Hypercholesterolemia    Peripheral vascular disease (HCC)    Prostate enlargement    Skin cancer    Sleep apnea     Family History  Problem Relation Age of Onset   Heart disease Mother    Kidney cancer Neg Hx    Bladder Cancer Neg Hx    Prostate cancer Neg Hx     Social History   Socioeconomic History   Marital status: Widowed    Spouse name: Not on file   Number of children: Not on file   Years of education: Not on file   Highest education level: Not on file  Occupational History   Not on file  Tobacco Use   Smoking status: Every Day   Smokeless tobacco: Never  Substance and Sexual Activity   Alcohol use: Yes    Alcohol/week: 3.0 standard drinks of alcohol    Types: 3 Standard drinks or equivalent per week   Drug use: No   Sexual activity: Not on file  Other Topics Concern   Not on file  Social History Narrative   Not on file   Social Determinants of Health   Financial Resource Strain: Low Risk  (04/28/2022)   Overall Financial Resource Strain (CARDIA)    Difficulty of Paying Living Expenses: Not hard at all  Food Insecurity: No Food Insecurity (04/28/2022)   Hunger Vital Sign     Worried About Running Out of Food in the Last Year: Never true    Ran Out of Food in the Last Year: Never true  Transportation Needs: No Transportation Needs (04/28/2022)   PRAPARE - Administrator, Civil Service (Medical): No    Lack of Transportation (Non-Medical): No  Physical Activity: Inactive (04/28/2022)   Exercise Vital Sign    Days of Exercise per Week: 0 days    Minutes of Exercise per Session: 0 min  Stress: No Stress Concern Present (04/28/2022)   [employer] of Occupational Health - Occupational Stress Questionnaire    Feeling of Stress : Only a little  Social Connections: Socially Isolated (04/28/2022)   Social Connection and Isolation Panel [NHANES]    Frequency of Communication with Friends and Family: Twice a week    Frequency of Social Gatherings with Friends and Family: Once a week    Attends Religious Services: Never    Database administrator or Organizations: No    Attends Banker Meetings: Never    Marital Status: Widowed  Intimate Partner Violence: Not At Risk (04/28/2022)   Humiliation, Afraid, Rape, and Kick questionnaire    Fear of Current or Ex-Partner: No  Emotionally Abused: No    Physically Abused: No    Sexually Abused: No    Allergies  Allergen Reactions   Iodine      Constitutional: Positive headache. Denies fatigue, fever or abrupt weight changes.  HEENT:  Positive facial pain and pressure. Denies eye redness, ear pain, ringing in the ears, wax buildup, nasal congestion, runny nose or sore throat. Respiratory:  Denies cough, difficulty breathing or shortness of breath.  Cardiovascular: Denies chest pain, chest tightness, palpitations or swelling in the hands or feet.   No other specific complaints in a complete review of systems (except as listed in HPI above).  Objective:   BP 118/62 (BP Location: Left Arm, Patient Position: Sitting, Cuff Size: Normal)   Pulse 96   Temp (!) 96.6 F (35.9 C) (Temporal)    Wt 195 lb (88.5 kg)   SpO2 99%   BMI 26.45 kg/m    General: Appears his stated age, overweight, in NAD. HEENT: Head: normal shape and size, left frontal sinus tenderness noted; Eyes: sclera white, no icterus, conjunctiva pink; Ears: Bilateral cerumen impaction; Nose: mucosa boggy and moist, septum midline; Throat/Mouth: + PND. Teeth present, mucosa erythematous and moist, no exudate noted, no lesions or ulcerations noted.  Neck:  No adenopathy noted.  Cardiovascular: Normal rate and rhythm. S1,S2 noted.  No murmur, rubs or gallops noted.  Pulmonary/Chest: Normal effort and diminished breath sounds. No respiratory distress. No wheezes, rales or ronchi noted.       Assessment & Plan:   Viral Sinusitis  Can use a Neti Pot which can be purchased from your local drug store. Flonase 2 sprays each nostril for 3 days and then as needed. eRx for Pred taper for symptom management No indication for antibiotics at this time  RTC as needed or if symptoms persist. Nicki Reaper, NP

## 2022-11-23 ENCOUNTER — Telehealth: Payer: Self-pay

## 2022-11-23 NOTE — Telephone Encounter (Signed)
Copied from CRM [id number]. Topic: General - Inquiry >> Nov 15, 2022  2:18 PM De Blanch wrote: Reason for CRM: Kam from [facility] asked that the office reach out to the patient regarding medication adherence for simvastatin (ZOCOR) 40 MG tablet and lisinopril (ZESTRIL) 10 MG tablet; they have been unable to reach pt.  Please advise.

## 2022-12-06 ENCOUNTER — Telehealth: Payer: Self-pay | Admitting: Family Medicine

## 2022-12-06 NOTE — Telephone Encounter (Signed)
Nephi with [facility] called in to let the provider know that the patient has not got his refill on his medication since March 5th. These medications are simvastatin (ZOCOR) 40 MG tablet and lisinopril (ZESTRIL) 10 MG tablet .That was a 90 day supply. His refills were called in again 5/17 but he never picked them up. She says he has been non adherent all year. Please assist further if necessary.

## 2022-12-06 NOTE — Telephone Encounter (Signed)
He has an apt in about 2 weeks, we can discuss it at next apt and if need refer to clinical pharmacy for assistance on  med adherence.  Saralyn Pilar, DO [facility] 12/06/2022, 11:30 AM

## 2022-12-08 ENCOUNTER — Other Ambulatory Visit: Payer: No Typology Code available for payment source

## 2022-12-15 ENCOUNTER — Encounter: Payer: No Typology Code available for payment source | Admitting: Family Medicine

## 2022-12-21 ENCOUNTER — Other Ambulatory Visit: Payer: No Typology Code available for payment source

## 2022-12-21 DIAGNOSIS — R350 Frequency of micturition: Secondary | ICD-10-CM | POA: Diagnosis not present

## 2022-12-21 DIAGNOSIS — I739 Peripheral vascular disease, unspecified: Secondary | ICD-10-CM

## 2022-12-21 DIAGNOSIS — I10 Essential (primary) hypertension: Secondary | ICD-10-CM | POA: Diagnosis not present

## 2022-12-21 DIAGNOSIS — E1159 Type 2 diabetes mellitus with other circulatory complications: Secondary | ICD-10-CM | POA: Diagnosis not present

## 2022-12-21 DIAGNOSIS — Z0000 Encounter for general adult medical examination without abnormal findings: Secondary | ICD-10-CM | POA: Diagnosis not present

## 2022-12-21 DIAGNOSIS — E1169 Type 2 diabetes mellitus with other specified complication: Secondary | ICD-10-CM | POA: Diagnosis not present

## 2022-12-21 DIAGNOSIS — N401 Enlarged prostate with lower urinary tract symptoms: Secondary | ICD-10-CM | POA: Diagnosis not present

## 2022-12-21 DIAGNOSIS — E785 Hyperlipidemia, unspecified: Secondary | ICD-10-CM | POA: Diagnosis not present

## 2022-12-28 ENCOUNTER — Ambulatory Visit (INDEPENDENT_AMBULATORY_CARE_PROVIDER_SITE_OTHER): Payer: No Typology Code available for payment source | Admitting: Family Medicine

## 2022-12-28 ENCOUNTER — Encounter: Payer: Self-pay | Admitting: Family Medicine

## 2022-12-28 VITALS — BP 109/61 | HR 78 | Temp 97.1°F | Ht 70.0 in | Wt 200.0 lb

## 2022-12-28 DIAGNOSIS — Z0000 Encounter for general adult medical examination without abnormal findings: Secondary | ICD-10-CM | POA: Diagnosis not present

## 2022-12-28 DIAGNOSIS — Z122 Encounter for screening for malignant neoplasm of respiratory organs: Secondary | ICD-10-CM

## 2022-12-28 DIAGNOSIS — E1159 Type 2 diabetes mellitus with other circulatory complications: Secondary | ICD-10-CM

## 2022-12-28 DIAGNOSIS — I739 Peripheral vascular disease, unspecified: Secondary | ICD-10-CM

## 2022-12-28 DIAGNOSIS — E785 Hyperlipidemia, unspecified: Secondary | ICD-10-CM

## 2022-12-28 DIAGNOSIS — R350 Frequency of micturition: Secondary | ICD-10-CM | POA: Diagnosis not present

## 2022-12-28 DIAGNOSIS — E1169 Type 2 diabetes mellitus with other specified complication: Secondary | ICD-10-CM

## 2022-12-28 DIAGNOSIS — N401 Enlarged prostate with lower urinary tract symptoms: Secondary | ICD-10-CM

## 2022-12-28 MED ORDER — METFORMIN HCL ER 750 MG PO TB24
750.0000 mg | ORAL_TABLET | Freq: Every day | ORAL | 3 refills | Status: DC
Start: 2022-12-28 — End: 2023-11-13

## 2022-12-28 NOTE — Patient Instructions (Addendum)
Thank you for coming to the office today.  Please share our info with Dr Clydene Pugh so he can send the request.  Recommend generic Mucinex or plain brand Mucinex, try to avoid the ones with any extra medicine or additive.  All refills have 90 day + refills, please [facility] when ready and they should be able to refill.  Start Metformin XR 750mg  daily with breakfast  Referral to Lung Doctor for low dose CT scan  Referral to [facility]   Address: [address] [place], [place] [address] Phone: [phone]  Website: [email or web address]   Please schedule a Follow-up Appointment to: Return in about 6 months (around 06/30/2023) for 6 month follow-up DM A1c, BPH updates.  If you have any other questions or concerns, please feel free to call the office or send a message through MyChart. You may also schedule an earlier appointment if necessary.  Additionally, you may be receiving a survey about your experience at our office within a few days to 1 week by e-mail or mail. We value your feedback.  Saralyn Pilar, DO [facility], [place]

## 2022-12-28 NOTE — Assessment & Plan Note (Addendum)
Improved on alpha blocker Tamsulosin 0.4mg  daily Consider finasteride, but he declines today Not returning to Urology at this time Has some mixed urinary symptoms, may have some OAB issues, unable to afford/cover myrbetriq previously, he prefers to defer management now PSA stable

## 2022-12-28 NOTE — Assessment & Plan Note (Signed)
Controlled cholesterol on statin lifestyle  Plan: 1. Continue current meds - Simvastatin '40mg'$  2. Continue ASA '81mg'$  for primary ASCVD risk reduction 3. Encourage improved lifestyle - low carb/cholesterol, reduce portion size, continue improving regular exercise

## 2022-12-28 NOTE — Assessment & Plan Note (Addendum)
A1c 8.6, stable, not controlled at this time Keep improving, he is diet controlled Complications - history of stroke, PAD  Plan:  1. Start Metformin XR 750mg  daily with breakfast 2. Goal to focus on diet now - Encourage improved lifestyle - low carb, low sugar diet, reduce portion size, continue improving regular exercise 3. Check CBG , bring log to next visit for review 4. Continue ASA, ACEi, Statin DM Foot, Urine Micro, DM Eye referral

## 2022-12-28 NOTE — Progress Notes (Signed)
Subjective:    Patient ID: [name], male    DOB: 10/09/1945, 77 y.o.   MRN: [id number]  [name] is a 77 y.o. male presenting on 12/28/2022 for Annual Exam   HPI  Here for Annual Physical and Lab Review  CHRONIC DM, Type 2: Last A1c 8.6, slightly elevated, prior 7 range 1-3 yr ago Attributed to ice cream and sweets. he will reduce sweet ice tea and sodas Reports controlled w/ lifestyle CBGs: Not checking regularly Meds: None. Currently on ACEi Denies hypoglycemia, polyuria, visual changes, numbness or tingling.   GERD / History PUD On Pantoprazole 40mg  daily, he says no problem with medicine, insurance asks about coverage as they will no longer cover in future. He has 3 month left Interested in lower dose.   BPH, w LUTS He wears adult diaper Uses OTC Saw Palmetto TID, limited relief No longer w/ [facility], they tried to order Myrbetriq and could not get med, he stopped going. On Tamsulosin 0.4mg  daily with some help   HYPERLIPIDEMIA: - Reports no concerns. Last lipid panel 12/2022 controlled LDL On Simvastatin 40mg  Lifestyle    Left Fem-Pop-Bypass about 10 yr ago 2 surgeries for repair   History of CVA 10/2019 He had diplopia double vision that ultimately led to a stroke. 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   Health Maintenance:  Referral to LDCT  Referral to Optometry return to [facility]  PSA 3.03, prior 2.3 range, stable     12/28/2022    8:07 AM 04/28/2022    1:36 PM  Depression screen PHQ 2/9  Decreased Interest 1 0  Down, Depressed, Hopeless 3 0  PHQ - 2 Score 4 0  Altered sleeping 2 0  Tired, decreased energy 1 0  Change in appetite 0 0  Feeling bad or failure about yourself  0 0  Trouble concentrating 0 0  Moving slowly or fidgety/restless 0 0  Suicidal thoughts 0 0  PHQ-9 Score 7 0  Difficult doing work/chores Somewhat difficult  Not difficult at all    Past Medical History:  Diagnosis Date   COPD (chronic obstructive pulmonary disease) (HCC)    Coronary artery disease    Diabetes mellitus without complication (HCC)    Gastric ulcer with hemorrhage    GERD (gastroesophageal reflux disease)    Headache    Hypercholesterolemia    Peripheral vascular disease (HCC)    Prostate enlargement    Skin cancer    Sleep apnea    Past Surgical History:  Procedure Laterality Date   CHOLECYSTECTOMY     circumscision     from army surgery for epididymitis   COLONOSCOPY WITH PROPOFOL N/A 07/28/2015   Procedure: COLONOSCOPY WITH PROPOFOL;  Surgeon: Scot Jun, MD;  Location: [facility] ENDOSCOPY;  Service: Endoscopy;  Laterality: N/A;   CORONARY ANGIOPLASTY  x2   HERNIA REPAIR     stomach ulcer surgery     Social History   Socioeconomic History   Marital status: Widowed    Spouse name: Not on file   Number of children: Not on file   Years of education: Not on file   Highest education level: Not on file  Occupational History   Not on file  Tobacco Use   Smoking status: Every Day   Smokeless tobacco: Never  Substance and Sexual Activity   Alcohol use: Yes  Alcohol/week: 3.0 standard drinks of alcohol    Types: 3 Standard drinks or equivalent per week   Drug use: No   Sexual activity: Not on file  Other Topics Concern   Not on file  Social History Narrative   Not on file   Social Determinants of Health   Financial Resource Strain: Low Risk  (04/28/2022)   Overall Financial Resource Strain (CARDIA)    Difficulty of Paying Living Expenses: Not hard at all  Food Insecurity: No Food Insecurity (04/28/2022)   Hunger Vital Sign    Worried About Running Out of Food in the Last Year: Never true    Ran Out of Food in the Last Year: Never true  Transportation Needs: No Transportation Needs (04/28/2022)   PRAPARE - Administrator, Civil Service (Medical): No    Lack of Transportation (Non-Medical):  No  Physical Activity: Inactive (04/28/2022)   Exercise Vital Sign    Days of Exercise per Week: 0 days    Minutes of Exercise per Session: 0 min  Stress: No Stress Concern Present (04/28/2022)   [employer] of Occupational Health - Occupational Stress Questionnaire    Feeling of Stress : Only a little  Social Connections: Socially Isolated (04/28/2022)   Social Connection and Isolation Panel [NHANES]    Frequency of Communication with Friends and Family: Twice a week    Frequency of Social Gatherings with Friends and Family: Once a week    Attends Religious Services: Never    Database administrator or Organizations: No    Attends Banker Meetings: Never    Marital Status: Widowed  Intimate Partner Violence: Not At Risk (04/28/2022)   Humiliation, Afraid, Rape, and Kick questionnaire    Fear of Current or Ex-Partner: No    Emotionally Abused: No    Physically Abused: No    Sexually Abused: No   Family History  Problem Relation Age of Onset   Heart disease Mother    Kidney cancer Neg Hx    Bladder Cancer Neg Hx    Prostate cancer Neg Hx    Current Outpatient Medications on File Prior to Visit  Medication Sig   aspirin EC 81 MG tablet Take 81 mg by mouth daily.   LACTOBACILLUS RHAMNOSUS, GG, PO Take 1 capsule by mouth daily.    lisinopril (ZESTRIL) 10 MG tablet TAKE 1 TABLET DAILY   Multiple Vitamin (MULTIVITAMIN) tablet Take 1 tablet by mouth daily.   pantoprazole (PROTONIX) 20 MG tablet Take 1 tablet (20 mg total) by mouth daily before breakfast.   simvastatin (ZOCOR) 40 MG tablet TAKE 1 TABLET DAILY   tamsulosin (FLOMAX) 0.4 MG CAPS capsule TAKE 1 CAPSULE DAILY AFTER BREAKFAST   No current facility-administered medications on file prior to visit.    Review of Systems  Constitutional:  Negative for activity change, appetite change, chills, diaphoresis, fatigue and fever.  HENT:  Negative for congestion and hearing loss.   Eyes:  Negative for visual  disturbance.  Respiratory:  Negative for cough, chest tightness, shortness of breath and wheezing.   Cardiovascular:  Negative for chest pain, palpitations and leg swelling.  Gastrointestinal:  Negative for abdominal pain, constipation, diarrhea, nausea and vomiting.  Genitourinary:  Negative for dysuria, frequency and hematuria.  Musculoskeletal:  Negative for arthralgias and neck pain.  Skin:  Negative for rash.  Neurological:  Negative for dizziness, weakness, light-headedness, numbness and headaches.  Hematological:  Negative for adenopathy.  Psychiatric/Behavioral:  Negative for behavioral problems,  dysphoric mood and sleep disturbance.    Per HPI unless specifically indicated above      Objective:    BP 109/61   Pulse 78   Temp (!) 97.1 F (36.2 C) (Temporal)   Ht 5\' 10"  (1.778 m)   Wt 200 lb (90.7 kg)   SpO2 97%   BMI 28.70 kg/m   Wt Readings from Last 3 Encounters:  12/28/22 200 lb (90.7 kg)  11/07/22 195 lb (88.5 kg)  06/09/22 217 lb 9.6 oz (98.7 kg)    Physical Exam Vitals and nursing note reviewed.  Constitutional:      General: He is not in acute distress.    Appearance: He is well-developed. He is not diaphoretic.     Comments: Well-appearing, comfortable, cooperative  HENT:     Head: Normocephalic and atraumatic.  Eyes:     General:        Right eye: No discharge.        Left eye: No discharge.     Conjunctiva/sclera: Conjunctivae normal.  Neck:     Thyroid: No thyromegaly.  Cardiovascular:     Rate and Rhythm: Normal rate and regular rhythm.     Pulses: Normal pulses.     Heart sounds: Normal heart sounds. No murmur heard. Pulmonary:     Effort: Pulmonary effort is normal. No respiratory distress.     Breath sounds: Normal breath sounds. No wheezing or rales.  Musculoskeletal:        General: Normal range of motion.     Cervical back: Normal range of motion and neck supple.  Lymphadenopathy:     Cervical: No cervical adenopathy.  Skin:     General: Skin is warm and dry.     Findings: No erythema or rash.  Neurological:     Mental Status: He is alert and oriented to person, place, and time. Mental status is at baseline.  Psychiatric:        Behavior: Behavior normal.     Comments: Well groomed, good eye contact, normal speech and thoughts     Diabetic Foot Exam - Simple   Simple Foot Form Diabetic Foot exam was performed with the following findings: Yes 12/28/2022  8:35 AM  Visual Inspection No deformities, no ulcerations, no other skin breakdown bilaterally: Yes Sensation Testing Intact to touch and monofilament testing bilaterally: Yes Pulse Check Posterior Tibialis and Dorsalis pulse intact bilaterally: Yes Comments     Results for orders placed or performed in visit on 12/21/22  Urine Microalbumin w/creat. ratio  Result Value Ref Range   Creatinine, Urine 131 20 - 320 mg/dL   Microalb, Ur 0.9 mg/dL   Microalb Creat Ratio 7 <30 mg/g creat  TSH  Result Value Ref Range   TSH 2.97 0.40 - 4.50 mIU/L  PSA  Result Value Ref Range   PSA 3.03 < OR = 4.00 ng/mL  Hemoglobin A1c  Result Value Ref Range   Hgb A1c MFr Bld 8.6 (H) <5.7 % of total Hgb   Mean Plasma Glucose 200 mg/dL   eAG (mmol/L) 95.2 mmol/L  Lipid panel  Result Value Ref Range   Cholesterol 137 <200 mg/dL   HDL 35 (L) > OR = 40 mg/dL   Triglycerides 841 <324 mg/dL   LDL Cholesterol (Calc) 79 mg/dL (calc)   Total CHOL/HDL Ratio 3.9 <5.0 (calc)   Non-HDL Cholesterol (Calc) 102 <130 mg/dL (calc)  CBC with Differential/Platelet  Result Value Ref Range   WBC 11.5 (H) 3.8 - 10.8  Thousand/uL   RBC 4.88 4.20 - 5.80 Million/uL   Hemoglobin 14.5 13.2 - 17.1 g/dL   HCT 91.4 78.2 - 95.6 %   MCV 91.4 80.0 - 100.0 fL   MCH 29.7 27.0 - 33.0 pg   MCHC 32.5 32.0 - 36.0 g/dL   RDW 21.3 08.6 - 57.8 %   Platelets 202 140 - 400 Thousand/uL   MPV 9.8 7.5 - 12.5 fL   Neutro Abs 7,843 (H) 1,500 - 7,800 cells/uL   Lymphs Abs 2,622 850 - 3,900 cells/uL    Absolute Monocytes 644 200 - 950 cells/uL   Eosinophils Absolute 334 15 - 500 cells/uL   Basophils Absolute 58 0 - 200 cells/uL   Neutrophils Relative % 68.2 %   Total Lymphocyte 22.8 %   Monocytes Relative 5.6 %   Eosinophils Relative 2.9 %   Basophils Relative 0.5 %  COMPLETE METABOLIC PANEL WITH GFR  Result Value Ref Range   Glucose, Bld 206 (H) 65 - 99 mg/dL   BUN 6 (L) 7 - 25 mg/dL   Creat 4.69 (L) 6.29 - 1.28 mg/dL   eGFR 99 > OR = 60 BM/WUX/3.24M0   BUN/Creatinine Ratio 10 6 - 22 (calc)   Sodium 140 135 - 146 mmol/L   Potassium 3.9 3.5 - 5.3 mmol/L   Chloride 102 98 - 110 mmol/L   CO2 30 20 - 32 mmol/L   Calcium 9.1 8.6 - 10.3 mg/dL   Total Protein 6.1 6.1 - 8.1 g/dL   Albumin 3.8 3.6 - 5.1 g/dL   Globulin 2.3 1.9 - 3.7 g/dL (calc)   AG Ratio 1.7 1.0 - 2.5 (calc)   Total Bilirubin 0.5 0.2 - 1.2 mg/dL   Alkaline phosphatase (APISO) 84 35 - 144 U/L   AST 10 10 - 35 U/L   ALT 13 9 - 46 U/L      Assessment & Plan:   Problem List Items Addressed This Visit     Benign prostatic hyperplasia with urinary frequency    Improved on alpha blocker Tamsulosin 0.4mg  daily Consider finasteride, but he declines today Not returning to Urology at this time Has some mixed urinary symptoms, may have some OAB issues, unable to afford/cover myrbetriq previously, he prefers to defer management now PSA stable      Hyperlipidemia associated with type 2 diabetes mellitus (HCC)    Controlled cholesterol on statin lifestyle  Plan: 1. Continue current meds - Simvastatin 40mg  2. Continue ASA 81mg  for primary ASCVD risk reduction 3. Encourage improved lifestyle - low carb/cholesterol, reduce portion size, continue improving regular exercise      Relevant Medications   metFORMIN (GLUCOPHAGE-XR) 750 MG 24 hr tablet   PAD (peripheral artery disease) (HCC)   Type 2 diabetes mellitus with other circulatory complications (HCC)    A1c 8.6, stable, not controlled at this time Keep improving,  he is diet controlled Complications - history of stroke, PAD  Plan:  1. Start Metformin XR 750mg  daily with breakfast 2. Goal to focus on diet now - Encourage improved lifestyle - low carb, low sugar diet, reduce portion size, continue improving regular exercise 3. Check CBG , bring log to next visit for review 4. Continue ASA, ACEi, Statin DM Foot, Urine Micro, DM Eye referral      Relevant Medications   metFORMIN (GLUCOPHAGE-XR) 750 MG 24 hr tablet   Other Relevant Orders   Ambulatory referral to Optometry   Other Visit Diagnoses     Annual physical exam    -  Primary   Screening for lung cancer       Relevant Orders   Ambulatory Referral for Lung Cancer Scre       Updated Health Maintenance information Reviewed recent lab results with patient Encouraged improvement to lifestyle with diet and exercise Goal of weight loss  Recommend generic Mucinex or plain brand Mucinex, try to avoid the ones with any extra medicine or additive.  All refills have 90 day + refills, please contact your [facility] when ready and they should be able to refill.  Referral to Lung CA Screening for low dose CT scan  Referral to [facility]  Orders Placed This Encounter  Procedures   Ambulatory Referral for Lung Cancer Scre    Referral Priority:   Routine    Referral Type:   Consultation    Referral Reason:   Specialty Services Required    Number of Visits Requested:   1   Ambulatory referral to Optometry    Referral Priority:   Routine    Referral Type:   Vision Training and development officer)    Referral Reason:   Specialty Services Required    Requested Specialty:   Optometry    Number of Visits Requested:   1      Meds ordered this encounter  Medications   metFORMIN (GLUCOPHAGE-XR) 750 MG 24 hr tablet    Sig: Take 1 tablet (750 mg total) by mouth daily with breakfast.    Dispense:  90 tablet    Refill:  3      Follow up plan: Return in about 6 months (around 06/30/2023) for 6  month follow-up DM A1c, BPH updates.  Saralyn Pilar, DO [facility] 12/28/2022, 8:14 AM

## 2023-03-19 ENCOUNTER — Other Ambulatory Visit: Payer: Self-pay | Admitting: Family Medicine

## 2023-03-19 DIAGNOSIS — K219 Gastro-esophageal reflux disease without esophagitis: Secondary | ICD-10-CM

## 2023-03-20 NOTE — Telephone Encounter (Signed)
Requested Prescriptions  Pending Prescriptions Disp Refills   pantoprazole (PROTONIX) 20 MG tablet [Pharmacy Med Name: PANTOPRAZOLE TAB 20MG  DR] 90 tablet 1    Sig: TAKE 1 TABLET DAILY BEFORE BREAKFAST - TO REPLACE     EXISTING 40MG , GOAL IS TO  TAPER DOWN     Gastroenterology: Proton Pump Inhibitors Passed - 03/19/2023  8:03 AM      Passed - Valid encounter within last 12 months    Recent Outpatient Visits           2 months ago Annual physical exam   [place] [facility] Smitty Cords, DO   4 months ago Viral sinusitis   [place] [facility] [place], [place] W, NP   9 months ago Type 2 diabetes mellitus with other circulatory complications [facility])   [place] [facility] Smitty Cords, DO   1 year ago Annual physical exam   [place] [facility] Smitty Cords, DO   1 year ago Hyperlipidemia associated with type 2 diabetes mellitus [facility])   [place] [facility] Althea Charon, Netta Neat, DO       Future Appointments             In 3 months Althea Charon, Netta Neat, DO [facility]

## 2023-03-28 DIAGNOSIS — E119 Type 2 diabetes mellitus without complications: Secondary | ICD-10-CM | POA: Diagnosis not present

## 2023-03-28 DIAGNOSIS — H532 Diplopia: Secondary | ICD-10-CM | POA: Diagnosis not present

## 2023-03-28 DIAGNOSIS — H26493 Other secondary cataract, bilateral: Secondary | ICD-10-CM | POA: Diagnosis not present

## 2023-03-28 DIAGNOSIS — H5203 Hypermetropia, bilateral: Secondary | ICD-10-CM | POA: Diagnosis not present

## 2023-03-28 DIAGNOSIS — H04123 Dry eye syndrome of bilateral lacrimal glands: Secondary | ICD-10-CM | POA: Diagnosis not present

## 2023-03-28 LAB — HM DIABETES EYE EXAM

## 2023-04-25 ENCOUNTER — Encounter: Payer: Self-pay | Admitting: Emergency Medicine

## 2023-05-15 DIAGNOSIS — H26491 Other secondary cataract, right eye: Secondary | ICD-10-CM | POA: Diagnosis not present

## 2023-05-15 DIAGNOSIS — H5213 Myopia, bilateral: Secondary | ICD-10-CM | POA: Diagnosis not present

## 2023-05-15 DIAGNOSIS — Z961 Presence of intraocular lens: Secondary | ICD-10-CM | POA: Diagnosis not present

## 2023-05-15 DIAGNOSIS — E119 Type 2 diabetes mellitus without complications: Secondary | ICD-10-CM | POA: Diagnosis not present

## 2023-07-02 ENCOUNTER — Ambulatory Visit: Payer: Self-pay | Admitting: Family Medicine

## 2023-09-10 ENCOUNTER — Other Ambulatory Visit: Payer: Self-pay | Admitting: Family Medicine

## 2023-09-10 DIAGNOSIS — K219 Gastro-esophageal reflux disease without esophagitis: Secondary | ICD-10-CM

## 2023-09-11 NOTE — Telephone Encounter (Signed)
 Too soon for refill.  Requested Prescriptions  Pending Prescriptions Disp Refills   pantoprazole  (PROTONIX ) 20 MG tablet [Pharmacy Med Name: PANTOPRAZOLE  TAB 20MG ] 90 tablet 1    Sig: TAKE 1 TABLET DAILY BEFORE BREAKFAST - TO REPLACE     EXISTING 40MG , GOAL IS TO  TAPER DOWN     Gastroenterology: Proton Pump Inhibitors Failed - 09/11/2023  4:20 PM      Failed - Valid encounter within last 12 months    Recent Outpatient Visits   None

## 2023-10-04 NOTE — Telephone Encounter (Unsigned)
 Copied from CRM [id number]. Topic: Clinical - Medication Refill >> Oct 04, 2023  8:29 AM Marissa P wrote: Medication: pantoprazole  (PROTONIX ) 20 MG tablet  Has the patient contacted their pharmacy? Yes (Agent: If no, request that the patient contact the pharmacy for the refill. If patient does not wish to contact the pharmacy document the reason why and proceed with request.) (Agent: If yes, when and what did the pharmacy advise?)  This is the patient's preferred pharmacy:    [facility] - [place], [place] - One [facility] AT Portal to [facility] [address] [place] [address] Phone: [phone] Fax: [phone]  Is this the correct pharmacy for this prescription? Yes If no, delete pharmacy and type the correct one.   Has the prescription been filled recently? Yes  Is the patient out of the medication? Yes  Has the patient been seen for an appointment in the last year OR does the patient have an upcoming appointment? Yes  Can we respond through MyChart? No  Agent: Please be advised that Rx refills may take up to 3 business days. We ask that you follow-up with your pharmacy.

## 2023-10-09 ENCOUNTER — Telehealth: Payer: Self-pay

## 2023-10-09 ENCOUNTER — Other Ambulatory Visit: Payer: Self-pay

## 2023-10-09 DIAGNOSIS — K219 Gastro-esophageal reflux disease without esophagitis: Secondary | ICD-10-CM

## 2023-10-09 MED ORDER — PANTOPRAZOLE SODIUM 20 MG PO TBEC
20.0000 mg | DELAYED_RELEASE_TABLET | Freq: Every day | ORAL | 1 refills | Status: DC
Start: 2023-10-09 — End: 2023-11-13

## 2023-10-09 NOTE — Telephone Encounter (Signed)
 Patient is requesting a refill on Pantoprazole  20mg  to  [facility].  Patient only has 10 pills left.

## 2023-10-09 NOTE — Telephone Encounter (Signed)
 Rx sent to pharmacy

## 2023-10-20 ENCOUNTER — Other Ambulatory Visit: Payer: Self-pay | Admitting: Family Medicine

## 2023-10-20 DIAGNOSIS — N401 Enlarged prostate with lower urinary tract symptoms: Secondary | ICD-10-CM

## 2023-10-22 NOTE — Telephone Encounter (Signed)
 OV 12/28/22 Requested Prescriptions  Pending Prescriptions Disp Refills   tamsulosin  (FLOMAX ) 0.4 MG CAPS capsule [Pharmacy Med Name: TAMSULOSIN  CAP 0.4MG ] 90 capsule 0    Sig: TAKE 1 CAPSULE DAILY AFTER BREAKFAST     Urology: Alpha-Adrenergic Blocker Failed - 10/22/2023  1:46 PM      Failed - Valid encounter within last 12 months    Recent Outpatient Visits   None            Passed - PSA in normal range and within 360 days    PSA  Date Value Ref Range Status  12/21/2022 3.03 < OR = 4.00 ng/mL Final    Comment:    The total PSA value from this assay system is  standardized against the WHO standard. The test  result will be approximately 20% lower when compared  to the equimolar-standardized total PSA (Beckman  Coulter). Comparison of serial PSA results should be  interpreted with this fact in mind. . This test was performed using the Siemens  chemiluminescent method. Values obtained from  different assay methods cannot be used interchangeably. PSA levels, regardless of value, should not be interpreted as absolute evidence of the presence or absence of disease.    Prostate Specific Ag, Serum  Date Value Ref Range Status  10/30/2016 2.3 0.0 - 4.0 ng/mL Final    Comment:    Roche ECLIA methodology. According to the American Urological Association, Serum PSA should decrease and remain at undetectable levels after radical prostatectomy. The AUA defines biochemical recurrence as an initial PSA value 0.2 ng/mL or greater followed by a subsequent confirmatory PSA value 0.2 ng/mL or greater. Values obtained with different assay methods or kits cannot be used interchangeably. Results cannot be interpreted as absolute evidence of the presence or absence of malignant disease.          Passed - Last BP in normal range    BP Readings from Last 1 Encounters:  12/28/22 109/61

## 2023-10-30 ENCOUNTER — Other Ambulatory Visit: Payer: Self-pay | Admitting: Family Medicine

## 2023-10-30 DIAGNOSIS — E1159 Type 2 diabetes mellitus with other circulatory complications: Secondary | ICD-10-CM

## 2023-10-30 DIAGNOSIS — E1169 Type 2 diabetes mellitus with other specified complication: Secondary | ICD-10-CM

## 2023-11-01 NOTE — Telephone Encounter (Signed)
 Requested medications are due for refill today.  yes  Requested medications are on the active medications list.  yes  Last refill. varied  Future visit scheduled.   no  Notes to clinic.  Pt last seen in office 12/28/2022 - pt n/s appt on 07/02/2023    Requested Prescriptions  Pending Prescriptions Disp Refills   metFORMIN  (GLUCOPHAGE -XR) 750 MG 24 hr tablet [Pharmacy Med Name: METFORMIN  ER TAB 750MG  GP] 90 tablet 3    Sig: TAKE 1 TABLET DAILY WITH   BREAKFAST     Endocrinology:  Diabetes - Biguanides Failed - 11/01/2023  4:42 PM      Failed - Cr in normal range and within 360 days    Creat  Date Value Ref Range Status  12/21/2022 0.63 (L) 0.70 - 1.28 mg/dL Final   Creatinine, Urine  Date Value Ref Range Status  12/21/2022 131 20 - 320 mg/dL Final         Failed - HBA1C is between 0 and 7.9 and within 180 days    Hemoglobin A1C  Date Value Ref Range Status  04/20/2012 7.0 (H) 4.2 - 6.3 % Final    Comment:    The American Diabetes Association recommends that a primary goal of therapy should be <7% and that physicians should reevaluate the treatment regimen in patients with HbA1c values consistently >8%.    Hgb A1c MFr Bld  Date Value Ref Range Status  12/21/2022 8.6 (H) <5.7 % of total Hgb Final    Comment:    For someone without known diabetes, a hemoglobin A1c value of 6.5% or greater indicates that they may have  diabetes and this should be confirmed with a follow-up  test. . For someone with known diabetes, a value <7% indicates  that their diabetes is well controlled and a value  greater than or equal to 7% indicates suboptimal  control. A1c targets should be individualized based on  duration of diabetes, age, comorbid conditions, and  other considerations. . Currently, no consensus exists regarding use of hemoglobin A1c for diagnosis of diabetes for children. .          Failed - B12 Level in normal range and within 720 days    No results found for:  VITAMINB12       Failed - Valid encounter within last 6 months    Recent Outpatient Visits   None            Passed - eGFR in normal range and within 360 days    EGFR (African American)  Date Value Ref Range Status  05/29/2012 >60  Final   GFR calc Af Amer  Date Value Ref Range Status  11/05/2019 >60 >60 mL/min Final   EGFR (Non-African Amer.)  Date Value Ref Range Status  05/29/2012 >60  Final    Comment:    eGFR values <3mL/min/1.73 m2 may be an indication of chronic kidney disease (CKD). Calculated eGFR is useful in patients with stable renal function. The eGFR calculation will not be reliable in acutely ill patients when serum creatinine is changing rapidly. It is not useful in  patients on dialysis. The eGFR calculation may not be applicable to patients at the low and high extremes of body sizes, pregnant women, and vegetarians.    GFR calc non Af Amer  Date Value Ref Range Status  11/05/2019 >60 >60 mL/min Final   eGFR  Date Value Ref Range Status  12/21/2022 99 > OR = 60 mL/min/1.24m2 Final  Passed - CBC within normal limits and completed in the last 12 months    WBC  Date Value Ref Range Status  12/21/2022 11.5 (H) 3.8 - 10.8 Thousand/uL Final   RBC  Date Value Ref Range Status  12/21/2022 4.88 4.20 - 5.80 Million/uL Final   Hemoglobin  Date Value Ref Range Status  12/21/2022 14.5 13.2 - 17.1 g/dL Final   HGB  Date Value Ref Range Status  06/02/2012 11.3 (L) 13.0 - 18.0 g/dL Final   HCT  Date Value Ref Range Status  12/21/2022 44.6 38.5 - 50.0 % Final  06/02/2012 34.4 (L) 40.0 - 52.0 % Final   MCHC  Date Value Ref Range Status  12/21/2022 32.5 32.0 - 36.0 g/dL Final   [facility]  Date Value Ref Range Status  12/21/2022 29.7 27.0 - 33.0 pg Final   MCV  Date Value Ref Range Status  12/21/2022 91.4 80.0 - 100.0 fL Final  06/02/2012 86 80 - 100 fL Final   No results found for: PLTCOUNTKUC, LABPLAT, POCPLA RDW  Date Value Ref  Range Status  12/21/2022 12.7 11.0 - 15.0 % Final  06/02/2012 14.1 11.5 - 14.5 % Final          simvastatin  (ZOCOR ) 40 MG tablet [Pharmacy Med Name: SIMVASTATIN   TAB 40MG ] 90 tablet 3    Sig: TAKE 1 TABLET DAILY     Cardiovascular:  Antilipid - Statins Failed - 11/01/2023  4:42 PM      Failed - Valid encounter within last 12 months    Recent Outpatient Visits   None            Failed - Lipid Panel in normal range within the last 12 months    Cholesterol  Date Value Ref Range Status  12/21/2022 137 <200 mg/dL Final  40/98/1191 478 0 - 200 mg/dL Final   Ldl Cholesterol, Calc  Date Value Ref Range Status  04/21/2012 47 0 - 100 mg/dL Final   LDL Cholesterol (Calc)  Date Value Ref Range Status  12/21/2022 79 mg/dL (calc) Final    Comment:    Reference range: <100 . Desirable range <100 mg/dL for primary prevention;   <70 mg/dL for patients with CHD or diabetic patients  with > or = 2 CHD risk factors. Aaron Aas LDL-C is now calculated using the Martin-Hopkins  calculation, which is a validated novel method providing  better accuracy than the Friedewald equation in the  estimation of LDL-C.  Melinda Sprawls et al. Erroll Heard. 2956;213(08): 2061-2068  (http://education.[facility].com/faq/FAQ164)    HDL Cholesterol  Date Value Ref Range Status  04/21/2012 18 (L) 40 - 60 mg/dL Final   HDL  Date Value Ref Range Status  12/21/2022 35 (L) > OR = 40 mg/dL Final   Triglycerides  Date Value Ref Range Status  12/21/2022 136 <150 mg/dL Final  65/78/4696 295 (H) 0 - 200 mg/dL Final         Passed - Patient is not pregnant       lisinopril  (ZESTRIL ) 10 MG tablet [Pharmacy Med Name: LISINOPRIL  TAB 10MG ] 90 tablet 3    Sig: TAKE 1 TABLET DAILY     Cardiovascular:  ACE Inhibitors Failed - 11/01/2023  4:42 PM      Failed - Cr in normal range and within 180 days    Creat  Date Value Ref Range Status  12/21/2022 0.63 (L) 0.70 - 1.28 mg/dL Final   Creatinine, Urine  Date Value Ref  Range Status  12/21/2022 131 20 -  320 mg/dL Final         Failed - K in normal range and within 180 days    Potassium  Date Value Ref Range Status  12/21/2022 3.9 3.5 - 5.3 mmol/L Final  05/29/2012 4.3 3.5 - 5.1 mmol/L Final         Failed - Valid encounter within last 6 months    Recent Outpatient Visits   None            Passed - Patient is not pregnant      Passed - Last BP in normal range    BP Readings from Last 1 Encounters:  12/28/22 109/61

## 2023-11-13 ENCOUNTER — Other Ambulatory Visit: Payer: Self-pay | Admitting: Family Medicine

## 2023-11-13 DIAGNOSIS — E1169 Type 2 diabetes mellitus with other specified complication: Secondary | ICD-10-CM

## 2023-11-13 DIAGNOSIS — E1159 Type 2 diabetes mellitus with other circulatory complications: Secondary | ICD-10-CM

## 2023-11-13 DIAGNOSIS — K219 Gastro-esophageal reflux disease without esophagitis: Secondary | ICD-10-CM

## 2023-11-13 NOTE — Telephone Encounter (Unsigned)
 Copied from CRM [id number]. Topic: Clinical - Medication Refill >> Nov 13, 2023  9:02 AM Montie POUR wrote: Medication: 90 day supply on each medication lisinopril  (ZESTRIL ) 10 MG tablet AND metFORMIN  (GLUCOPHAGE -XR) 750 MG 24 hr tablet AND pantoprazole  (PROTONIX ) 20 MG tablet AND simvastatin  (ZOCOR ) 40 MG tablet  Has the patient contacted their pharmacy? Yes (Agent: If no, request that the patient contact the pharmacy for the refill. If patient does not wish to contact the pharmacy document the reason why and proceed with request.) (Agent: If yes, when and what did the pharmacy advise?) Pharmacy needs order to refill  This is the patient's preferred pharmacy:  [facility] - [place], [place] - One [facility] AT Portal to [facility] One [place] [address] Phone: [phone] Fax: [phone]  Is this the correct pharmacy for this prescription? Yes If no, delete pharmacy and type the correct one.   Has the prescription been filled recently? No  Is the patient out of the medication? No  Has the patient been seen for an appointment in the last year OR does the patient have an upcoming appointment? Yes  Can we respond through MyChart? No  Agent: Please be advised that Rx refills may take up to 3 business days. We ask that you follow-up with your pharmacy.

## 2023-11-14 MED ORDER — METFORMIN HCL ER 750 MG PO TB24: 750.0000 mg | ORAL_TABLET | Freq: Every day | ORAL | 0 refills | Status: DC

## 2023-11-14 MED ORDER — SIMVASTATIN 40 MG PO TABS: 40.0000 mg | ORAL_TABLET | Freq: Every day | ORAL | 0 refills | Status: DC

## 2023-11-14 MED ORDER — PANTOPRAZOLE SODIUM 20 MG PO TBEC
20.0000 mg | DELAYED_RELEASE_TABLET | Freq: Every day | ORAL | 0 refills | Status: DC
Start: 2023-11-14 — End: 2023-12-18

## 2023-11-14 MED ORDER — LISINOPRIL 10 MG PO TABS: 10.0000 mg | ORAL_TABLET | Freq: Every day | ORAL | 0 refills | Status: DC

## 2023-11-14 NOTE — Telephone Encounter (Signed)
 Refilled for 30 days, future OV scheduled.  Requested Prescriptions  Pending Prescriptions Disp Refills   lisinopril  (ZESTRIL ) 10 MG tablet 30 tablet 0    Sig: Take 1 tablet (10 mg total) by mouth daily.     Cardiovascular:  ACE Inhibitors Failed - 11/14/2023  4:38 PM      Failed - Cr in normal range and within 180 days    Creat  Date Value Ref Range Status  12/21/2022 0.63 (L) 0.70 - 1.28 mg/dL Final   Creatinine, Urine  Date Value Ref Range Status  12/21/2022 131 20 - 320 mg/dL Final         Failed - K in normal range and within 180 days    Potassium  Date Value Ref Range Status  12/21/2022 3.9 3.5 - 5.3 mmol/L Final  05/29/2012 4.3 3.5 - 5.1 mmol/L Final         Failed - Valid encounter within last 6 months    Recent Outpatient Visits   None            Passed - Patient is not pregnant      Passed - Last BP in normal range    BP Readings from Last 1 Encounters:  12/28/22 109/61          metFORMIN  (GLUCOPHAGE -XR) 750 MG 24 hr tablet 30 tablet 0    Sig: Take 1 tablet (750 mg total) by mouth daily with breakfast.     Endocrinology:  Diabetes - Biguanides Failed - 11/14/2023  4:38 PM      Failed - Cr in normal range and within 360 days    Creat  Date Value Ref Range Status  12/21/2022 0.63 (L) 0.70 - 1.28 mg/dL Final   Creatinine, Urine  Date Value Ref Range Status  12/21/2022 131 20 - 320 mg/dL Final         Failed - HBA1C is between 0 and 7.9 and within 180 days    Hemoglobin A1C  Date Value Ref Range Status  04/20/2012 7.0 (H) 4.2 - 6.3 % Final    Comment:    The American Diabetes Association recommends that a primary goal of therapy should be <7% and that physicians should reevaluate the treatment regimen in patients with HbA1c values consistently >8%.    Hgb A1c MFr Bld  Date Value Ref Range Status  12/21/2022 8.6 (H) <5.7 % of total Hgb Final    Comment:    For someone without known diabetes, a hemoglobin A1c value of 6.5% or greater indicates  that they may have  diabetes and this should be confirmed with a follow-up  test. . For someone with known diabetes, a value <7% indicates  that their diabetes is well controlled and a value  greater than or equal to 7% indicates suboptimal  control. A1c targets should be individualized based on  duration of diabetes, age, comorbid conditions, and  other considerations. . Currently, no consensus exists regarding use of hemoglobin A1c for diagnosis of diabetes for children. .          Failed - B12 Level in normal range and within 720 days    No results found for: VITAMINB12       Failed - Valid encounter within last 6 months    Recent Outpatient Visits   None            Passed - eGFR in normal range and within 360 days    EGFR (African American)  Date Value Ref  Range Status  05/29/2012 >60  Final   GFR calc Af Amer  Date Value Ref Range Status  11/05/2019 >60 >60 mL/min Final   EGFR (Non-African Amer.)  Date Value Ref Range Status  05/29/2012 >60  Final    Comment:    eGFR values <34mL/min/1.73 m2 may be an indication of chronic kidney disease (CKD). Calculated eGFR is useful in patients with stable renal function. The eGFR calculation will not be reliable in acutely ill patients when serum creatinine is changing rapidly. It is not useful in  patients on dialysis. The eGFR calculation may not be applicable to patients at the low and high extremes of body sizes, pregnant women, and vegetarians.    GFR calc non Af Amer  Date Value Ref Range Status  11/05/2019 >60 >60 mL/min Final   eGFR  Date Value Ref Range Status  12/21/2022 99 > OR = 60 mL/min/1.46m2 Final         Passed - CBC within normal limits and completed in the last 12 months    WBC  Date Value Ref Range Status  12/21/2022 11.5 (H) 3.8 - 10.8 Thousand/uL Final   RBC  Date Value Ref Range Status  12/21/2022 4.88 4.20 - 5.80 Million/uL Final   Hemoglobin  Date Value Ref Range Status   12/21/2022 14.5 13.2 - 17.1 g/dL Final   HGB  Date Value Ref Range Status  06/02/2012 11.3 (L) 13.0 - 18.0 g/dL Final   HCT  Date Value Ref Range Status  12/21/2022 44.6 38.5 - 50.0 % Final  06/02/2012 34.4 (L) 40.0 - 52.0 % Final   MCHC  Date Value Ref Range Status  12/21/2022 32.5 32.0 - 36.0 g/dL Final   [facility]  Date Value Ref Range Status  12/21/2022 29.7 27.0 - 33.0 pg Final   MCV  Date Value Ref Range Status  12/21/2022 91.4 80.0 - 100.0 fL Final  06/02/2012 86 80 - 100 fL Final   No results found for: PLTCOUNTKUC, LABPLAT, POCPLA RDW  Date Value Ref Range Status  12/21/2022 12.7 11.0 - 15.0 % Final  06/02/2012 14.1 11.5 - 14.5 % Final          pantoprazole  (PROTONIX ) 20 MG tablet 30 tablet 0    Sig: Take 1 tablet (20 mg total) by mouth daily.     Gastroenterology: Proton Pump Inhibitors Failed - 11/14/2023  4:38 PM      Failed - Valid encounter within last 12 months    Recent Outpatient Visits   None             simvastatin  (ZOCOR ) 40 MG tablet 30 tablet 0    Sig: Take 1 tablet (40 mg total) by mouth daily.     Cardiovascular:  Antilipid - Statins Failed - 11/14/2023  4:38 PM      Failed - Valid encounter within last 12 months    Recent Outpatient Visits   None            Failed - Lipid Panel in normal range within the last 12 months    Cholesterol  Date Value Ref Range Status  12/21/2022 137 <200 mg/dL Final  87/91/7986 892 0 - 200 mg/dL Final   Ldl Cholesterol, Calc  Date Value Ref Range Status  04/21/2012 47 0 - 100 mg/dL Final   LDL Cholesterol (Calc)  Date Value Ref Range Status  12/21/2022 79 mg/dL (calc) Final    Comment:    Reference range: <100 . Desirable range <100  mg/dL for primary prevention;   <70 mg/dL for patients with CHD or diabetic patients  with > or = 2 CHD risk factors. SABRA LDL-C is now calculated using the Martin-Hopkins  calculation, which is a validated novel method providing  better accuracy than the  Friedewald equation in the  estimation of LDL-C.  Gladis APPLETHWAITE et al. SANDREA. 7986;689(80): 2061-2068  (http://education.[facility].com/faq/FAQ164)    HDL Cholesterol  Date Value Ref Range Status  04/21/2012 18 (L) 40 - 60 mg/dL Final   HDL  Date Value Ref Range Status  12/21/2022 35 (L) > OR = 40 mg/dL Final   Triglycerides  Date Value Ref Range Status  12/21/2022 136 <150 mg/dL Final  87/91/7986 788 (H) 0 - 200 mg/dL Final         Passed - Patient is not pregnant

## 2023-12-08 ENCOUNTER — Other Ambulatory Visit: Payer: Self-pay | Admitting: Family Medicine

## 2023-12-08 DIAGNOSIS — E1159 Type 2 diabetes mellitus with other circulatory complications: Secondary | ICD-10-CM

## 2023-12-08 DIAGNOSIS — E1169 Type 2 diabetes mellitus with other specified complication: Secondary | ICD-10-CM

## 2023-12-10 NOTE — Telephone Encounter (Signed)
 Requested Prescriptions  Pending Prescriptions Disp Refills   lisinopril  (ZESTRIL ) 10 MG tablet [Pharmacy Med Name: LISINOPRIL  TAB 10MG ] 90 tablet 0    Sig: TAKE 1 TABLET DAILY     Cardiovascular:  ACE Inhibitors Failed - 12/10/2023  3:58 PM      Failed - Cr in normal range and within 180 days    Creat  Date Value Ref Range Status  12/21/2022 0.63 (L) 0.70 - 1.28 mg/dL Final   Creatinine, Urine  Date Value Ref Range Status  12/21/2022 131 20 - 320 mg/dL Final         Failed - K in normal range and within 180 days    Potassium  Date Value Ref Range Status  12/21/2022 3.9 3.5 - 5.3 mmol/L Final  05/29/2012 4.3 3.5 - 5.1 mmol/L Final         Failed - Valid encounter within last 6 months    Recent Outpatient Visits   None            Passed - Patient is not pregnant      Passed - Last BP in normal range    BP Readings from Last 1 Encounters:  12/28/22 109/61          metFORMIN  (GLUCOPHAGE -XR) 750 MG 24 hr tablet [Pharmacy Med Name: METFORMIN  ER TAB 750MG  GP] 90 tablet 0    Sig: TAKE 1 TABLET DAILY WITH   BREAKFAST     Endocrinology:  Diabetes - Biguanides Failed - 12/10/2023  3:58 PM      Failed - Cr in normal range and within 360 days    Creat  Date Value Ref Range Status  12/21/2022 0.63 (L) 0.70 - 1.28 mg/dL Final   Creatinine, Urine  Date Value Ref Range Status  12/21/2022 131 20 - 320 mg/dL Final         Failed - HBA1C is between 0 and 7.9 and within 180 days    Hemoglobin A1C  Date Value Ref Range Status  04/20/2012 7.0 (H) 4.2 - 6.3 % Final    Comment:    The American Diabetes Association recommends that a primary goal of therapy should be <7% and that physicians should reevaluate the treatment regimen in patients with HbA1c values consistently >8%.    Hgb A1c MFr Bld  Date Value Ref Range Status  12/21/2022 8.6 (H) <5.7 % of total Hgb Final    Comment:    For someone without known diabetes, a hemoglobin A1c value of 6.5% or greater indicates that  they may have  diabetes and this should be confirmed with a follow-up  test. . For someone with known diabetes, a value <7% indicates  that their diabetes is well controlled and a value  greater than or equal to 7% indicates suboptimal  control. A1c targets should be individualized based on  duration of diabetes, age, comorbid conditions, and  other considerations. . Currently, no consensus exists regarding use of hemoglobin A1c for diagnosis of diabetes for children. .          Failed - B12 Level in normal range and within 720 days    No results found for: VITAMINB12       Failed - Valid encounter within last 6 months    Recent Outpatient Visits   None            Passed - eGFR in normal range and within 360 days    EGFR (African American)  Date Value Ref Range Status  05/29/2012 >60  Final   GFR calc Af [place]  Date Value Ref Range Status  11/05/2019 >60 >60 mL/min Final   EGFR (Non-African Amer.)  Date Value Ref Range Status  05/29/2012 >60  Final    Comment:    eGFR values <80mL/min/1.73 m2 may be an indication of chronic kidney disease (CKD). Calculated eGFR is useful in patients with stable renal function. The eGFR calculation will not be reliable in acutely ill patients when serum creatinine is changing rapidly. It is not useful in  patients on dialysis. The eGFR calculation may not be applicable to patients at the low and high extremes of body sizes, pregnant women, and vegetarians.    GFR calc non Af Amer  Date Value Ref Range Status  11/05/2019 >60 >60 mL/min Final   eGFR  Date Value Ref Range Status  12/21/2022 99 > OR = 60 mL/min/1.14m2 Final         Passed - CBC within normal limits and completed in the last 12 months    WBC  Date Value Ref Range Status  12/21/2022 11.5 (H) 3.8 - 10.8 Thousand/uL Final   RBC  Date Value Ref Range Status  12/21/2022 4.88 4.20 - 5.80 Million/uL Final   Hemoglobin  Date Value Ref Range Status  12/21/2022  14.5 13.2 - 17.1 g/dL Final   HGB  Date Value Ref Range Status  06/02/2012 11.3 (L) 13.0 - 18.0 g/dL Final   HCT  Date Value Ref Range Status  12/21/2022 44.6 38.5 - 50.0 % Final  06/02/2012 34.4 (L) 40.0 - 52.0 % Final   MCHC  Date Value Ref Range Status  12/21/2022 32.5 32.0 - 36.0 g/dL Final   [facility]  Date Value Ref Range Status  12/21/2022 29.7 27.0 - 33.0 pg Final   MCV  Date Value Ref Range Status  12/21/2022 91.4 80.0 - 100.0 fL Final  06/02/2012 86 80 - 100 fL Final   No results found for: PLTCOUNTKUC, LABPLAT, POCPLA RDW  Date Value Ref Range Status  12/21/2022 12.7 11.0 - 15.0 % Final  06/02/2012 14.1 11.5 - 14.5 % Final          simvastatin  (ZOCOR ) 40 MG tablet [Pharmacy Med Name: SIMVASTATIN   TAB 40MG ] 90 tablet 0    Sig: TAKE 1 TABLET DAILY     Cardiovascular:  Antilipid - Statins Failed - 12/10/2023  3:58 PM      Failed - Valid encounter within last 12 months    Recent Outpatient Visits   None            Failed - Lipid Panel in normal range within the last 12 months    Cholesterol  Date Value Ref Range Status  12/21/2022 137 <200 mg/dL Final  87/91/7986 892 0 - 200 mg/dL Final   Ldl Cholesterol, Calc  Date Value Ref Range Status  04/21/2012 47 0 - 100 mg/dL Final   LDL Cholesterol (Calc)  Date Value Ref Range Status  12/21/2022 79 mg/dL (calc) Final    Comment:    Reference range: <100 . Desirable range <100 mg/dL for primary prevention;   <70 mg/dL for patients with CHD or diabetic patients  with > or = 2 CHD risk factors. SABRA LDL-C is now calculated using the Martin-Hopkins  calculation, which is a validated novel method providing  better accuracy than the Friedewald equation in the  estimation of LDL-C.  Gladis APPLETHWAITE et al. SANDREA. 7986;689(80): 2061-2068  (http://education.[facility].com/faq/FAQ164)    HDL Cholesterol  Date Value Ref Range Status  04/21/2012 18 (L) 40 - 60 mg/dL Final   HDL  Date Value Ref Range Status   12/21/2022 35 (L) > OR = 40 mg/dL Final   Triglycerides  Date Value Ref Range Status  12/21/2022 136 <150 mg/dL Final  87/91/7986 788 (H) 0 - 200 mg/dL Final         Passed - Patient is not pregnant

## 2023-12-18 ENCOUNTER — Other Ambulatory Visit: Payer: Self-pay

## 2023-12-18 DIAGNOSIS — K219 Gastro-esophageal reflux disease without esophagitis: Secondary | ICD-10-CM

## 2023-12-18 MED ORDER — PANTOPRAZOLE SODIUM 20 MG PO TBEC: 20.0000 mg | DELAYED_RELEASE_TABLET | Freq: Every day | ORAL | 0 refills | Status: DC

## 2023-12-31 ENCOUNTER — Encounter: Payer: Self-pay | Admitting: Family Medicine

## 2023-12-31 ENCOUNTER — Ambulatory Visit (INDEPENDENT_AMBULATORY_CARE_PROVIDER_SITE_OTHER): Admitting: Family Medicine

## 2023-12-31 VITALS — BP 110/60 | HR 66 | Wt 202.2 lb

## 2023-12-31 DIAGNOSIS — E1159 Type 2 diabetes mellitus with other circulatory complications: Secondary | ICD-10-CM

## 2023-12-31 DIAGNOSIS — K219 Gastro-esophageal reflux disease without esophagitis: Secondary | ICD-10-CM | POA: Diagnosis not present

## 2023-12-31 DIAGNOSIS — E1169 Type 2 diabetes mellitus with other specified complication: Secondary | ICD-10-CM

## 2023-12-31 DIAGNOSIS — Z0000 Encounter for general adult medical examination without abnormal findings: Secondary | ICD-10-CM

## 2023-12-31 DIAGNOSIS — R350 Frequency of micturition: Secondary | ICD-10-CM

## 2023-12-31 DIAGNOSIS — N401 Enlarged prostate with lower urinary tract symptoms: Secondary | ICD-10-CM

## 2023-12-31 DIAGNOSIS — E785 Hyperlipidemia, unspecified: Secondary | ICD-10-CM

## 2023-12-31 DIAGNOSIS — Z23 Encounter for immunization: Secondary | ICD-10-CM | POA: Diagnosis not present

## 2023-12-31 DIAGNOSIS — I739 Peripheral vascular disease, unspecified: Secondary | ICD-10-CM

## 2023-12-31 DIAGNOSIS — Z7984 Long term (current) use of oral hypoglycemic drugs: Secondary | ICD-10-CM | POA: Diagnosis not present

## 2023-12-31 MED ORDER — PANTOPRAZOLE SODIUM 20 MG PO TBEC: 20.0000 mg | DELAYED_RELEASE_TABLET | Freq: Every day | ORAL | 3 refills | Status: AC

## 2023-12-31 MED ORDER — METFORMIN HCL ER 750 MG PO TB24: 750.0000 mg | ORAL_TABLET | Freq: Every day | ORAL | 3 refills | Status: AC

## 2023-12-31 MED ORDER — LISINOPRIL 10 MG PO TABS: 10.0000 mg | ORAL_TABLET | Freq: Every day | ORAL | 3 refills | Status: AC

## 2023-12-31 MED ORDER — SIMVASTATIN 40 MG PO TABS: 40.0000 mg | ORAL_TABLET | Freq: Every day | ORAL | 3 refills | Status: AC

## 2023-12-31 MED ORDER — TAMSULOSIN HCL 0.4 MG PO CAPS: 0.4000 mg | ORAL_CAPSULE | Freq: Every day | ORAL | 3 refills | Status: AC

## 2023-12-31 NOTE — Progress Notes (Unsigned)
 Subjective:    Patient ID: [name], male    DOB: 05-Apr-1946, 78 y.o.   MRN: [id number]  [name] is a 78 y.o. male presenting on 12/31/2023 for Medical Management of Chronic Issues (Concerned about mucus )   HPI  Discussed the use of AI scribe software for clinical note transcription with the patient, who gave verbal consent to proceed.  History of Present Illness    CHRONIC DM, Type 2: Last A1c 8.6, slightly elevated, prior 7 range 1-3 yr ago Attributed to ice cream and sweets. he will reduce sweet ice tea and sodas Reports controlled w/ lifestyle CBGs: Not checking regularly Meds: None. Currently on ACEi Denies hypoglycemia, polyuria, visual changes, numbness or tingling.   GERD / History PUD On Pantoprazole  40mg  daily, he says no problem with medicine, insurance asks about coverage as they will no longer cover in future. He has 3 month left Interested in lower dose.   BPH, w LUTS He wears adult diaper Uses OTC Saw Palmetto TID, limited relief No longer w/ [facility], they tried to order Myrbetriq  and could not get med, he stopped going. On Tamsulosin  0.4mg  daily with some help   HYPERLIPIDEMIA: - Reports no concerns. Last lipid panel 12/2022 controlled LDL On Simvastatin  40mg  Lifestyle    Left Fem-Pop-Bypass about 10 yr ago 2 surgeries for repair   History of CVA 10/2019 He had diplopia double vision that ultimately led to a stroke. 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     Health Maintenance:   Referral to LDCT   Referral to Optometry return to [facility]   PSA 3.03, prior 2.3 range, stable     12/28/2022    8:07 AM 04/28/2022    1:36 PM  Depression screen PHQ 2/9  Decreased Interest 1 0  Down, Depressed, Hopeless 3 0  PHQ - 2 Score 4 0  Altered sleeping 2 0  Tired, decreased energy 1 0  Change in appetite 0 0  Feeling bad or failure  about yourself  0 0  Trouble concentrating 0 0  Moving slowly or fidgety/restless 0 0  Suicidal thoughts 0 0  PHQ-9 Score 7 0  Difficult doing work/chores Somewhat difficult Not difficult at all       12/28/2022    8:08 AM  GAD 7 : Generalized Anxiety Score  Nervous, Anxious, on Edge 2  Control/stop worrying 1  Worry too much - different things 2  Trouble relaxing 0  Restless 0  Easily annoyed or irritable 1  Afraid - awful might happen 0  Total GAD 7 Score 6  Anxiety Difficulty Somewhat difficult     Past Medical History:  Diagnosis Date   COPD (chronic obstructive pulmonary disease) (HCC)    Coronary artery disease    Diabetes mellitus without complication (HCC)    Gastric ulcer with hemorrhage    GERD (gastroesophageal reflux disease)    Headache    Hypercholesterolemia    Peripheral vascular disease (HCC)    Prostate enlargement    Skin cancer    Sleep apnea    Past Surgical History:  Procedure Laterality Date   CHOLECYSTECTOMY     circumscision     from [employer] surgery for epididymitis   COLONOSCOPY WITH PROPOFOL  N/A 07/28/2015   Procedure: COLONOSCOPY WITH PROPOFOL ;  Surgeon: Lamar ONEIDA Holmes, MD;  Location: [facility] ENDOSCOPY;  Service:  Endoscopy;  Laterality: N/A;   CORONARY ANGIOPLASTY  x2   HERNIA REPAIR     stomach ulcer surgery     Social History   Socioeconomic History   Marital status: Widowed    Spouse name: Not on file   Number of children: Not on file   Years of education: Not on file   Highest education level: Not on file  Occupational History   Not on file  Tobacco Use   Smoking status: Every Day   Smokeless tobacco: Never  Substance and Sexual Activity   Alcohol use: Yes    Alcohol/week: 3.0 standard drinks of alcohol    Types: 3 Standard drinks or equivalent per week   Drug use: No   Sexual activity: Not on file  Other Topics Concern   Not on file  Social History Narrative   Not on file   Social Drivers of Health   Financial  Resource Strain: Low Risk  (04/28/2022)   Overall Financial Resource Strain (CARDIA)    Difficulty of Paying Living Expenses: Not hard at all  Food Insecurity: No Food Insecurity (04/28/2022)   Hunger Vital Sign    Worried About Running Out of Food in the Last Year: Never true    Ran Out of Food in the Last Year: Never true  Transportation Needs: No Transportation Needs (04/28/2022)   PRAPARE - Administrator, Civil Service (Medical): No    Lack of Transportation (Non-Medical): No  Physical Activity: Inactive (04/28/2022)   Exercise Vital Sign    Days of Exercise per Week: 0 days    Minutes of Exercise per Session: 0 min  Stress: No Stress Concern Present (04/28/2022)   [employer] of Occupational Health - Occupational Stress Questionnaire    Feeling of Stress : Only a little  Social Connections: Socially Isolated (04/28/2022)   Social Connection and Isolation Panel    Frequency of Communication with Friends and Family: Twice a week    Frequency of Social Gatherings with Friends and Family: Once a week    Attends Religious Services: Never    Database administrator or Organizations: No    Attends Banker Meetings: Never    Marital Status: Widowed  Intimate Partner Violence: Not At Risk (04/28/2022)   Humiliation, Afraid, Rape, and Kick questionnaire    Fear of Current or Ex-Partner: No    Emotionally Abused: No    Physically Abused: No    Sexually Abused: No   Family History  Problem Relation Age of Onset   Heart disease Mother    Kidney cancer Neg Hx    Bladder Cancer Neg Hx    Prostate cancer Neg Hx    Current Outpatient Medications on File Prior to Visit  Medication Sig   aspirin  EC 81 MG tablet Take 81 mg by mouth daily.   LACTOBACILLUS RHAMNOSUS, GG, PO Take 1 capsule by mouth daily.    Multiple Vitamin (MULTIVITAMIN) tablet Take 1 tablet by mouth daily.   No current facility-administered medications on file prior to visit.    Review  of Systems Per HPI unless specifically indicated above     Objective:    BP 110/60 (BP Location: Left Arm, Patient Position: Sitting, Cuff Size: Small)   Pulse 66   Wt 202 lb 4 oz (91.7 kg)   SpO2 (!) 88%   BMI 29.02 kg/m   Wt Readings from Last 3 Encounters:  12/31/23 202 lb 4 oz (91.7 kg)  12/28/22 200  lb (90.7 kg)  11/07/22 195 lb (88.5 kg)    Physical Exam  Results for orders placed or performed in visit on 03/29/23  HM DIABETES EYE EXAM   Collection Time: 03/28/23 12:00 AM  Result Value Ref Range   HM Diabetic Eye Exam No Retinopathy No Retinopathy      Assessment & Plan:   Problem List Items Addressed This Visit     Benign prostatic hyperplasia with urinary frequency   Relevant Medications   tamsulosin  (FLOMAX ) 0.4 MG CAPS capsule   Other Relevant Orders   PSA   Gastroesophageal reflux disease without esophagitis   Relevant Medications   pantoprazole  (PROTONIX ) 20 MG tablet   Hyperlipidemia associated with type 2 diabetes mellitus (HCC)   Relevant Medications   simvastatin  (ZOCOR ) 40 MG tablet   metFORMIN  (GLUCOPHAGE -XR) 750 MG 24 hr tablet   lisinopril  (ZESTRIL ) 10 MG tablet   Other Relevant Orders   Lipid panel   CBC with Differential/Platelet   TSH   Comprehensive metabolic panel with GFR   PAD (peripheral artery disease) (HCC)   Relevant Medications   simvastatin  (ZOCOR ) 40 MG tablet   lisinopril  (ZESTRIL ) 10 MG tablet   Other Relevant Orders   Lipid panel   CBC with Differential/Platelet   Comprehensive metabolic panel with GFR   Type 2 diabetes mellitus with other circulatory complications (HCC)   Relevant Medications   simvastatin  (ZOCOR ) 40 MG tablet   metFORMIN  (GLUCOPHAGE -XR) 750 MG 24 hr tablet   lisinopril  (ZESTRIL ) 10 MG tablet   Other Relevant Orders   Hemoglobin A1c   Microalbumin / creatinine urine ratio   Other Visit Diagnoses       Annual physical exam    -  Primary   Relevant Orders   Lipid panel   Hemoglobin A1c   CBC  with Differential/Platelet   PSA   TSH   Comprehensive metabolic panel with GFR     Need for Streptococcus pneumoniae vaccination       Relevant Orders   Pneumococcal conjugate vaccine 20-valent        Updated Health Maintenance information Ordered fasting labs today pending Encouraged improvement to lifestyle with diet and exercise -*** Goal of weight loss  Assessment and Plan Assessment & Plan      Orders Placed This Encounter  Procedures   Pneumococcal conjugate vaccine 20-valent   Lipid panel    Has the patient fasted?:   Yes   Hemoglobin A1c   CBC with Differential/Platelet   PSA   Microalbumin / creatinine urine ratio   TSH   Comprehensive metabolic panel with GFR    Has the patient fasted?:   Yes    Meds ordered this encounter  Medications   tamsulosin  (FLOMAX ) 0.4 MG CAPS capsule    Sig: Take 1 capsule (0.4 mg total) by mouth daily after breakfast.    Dispense:  90 capsule    Refill:  3    Add future refills when patient is ready   simvastatin  (ZOCOR ) 40 MG tablet    Sig: Take 1 tablet (40 mg total) by mouth daily.    Dispense:  90 tablet    Refill:  3    Add future refills when patient is ready   pantoprazole  (PROTONIX ) 20 MG tablet    Sig: Take 1 tablet (20 mg total) by mouth daily before breakfast.    Dispense:  90 tablet    Refill:  3    Add future refills when patient is ready  metFORMIN  (GLUCOPHAGE -XR) 750 MG 24 hr tablet    Sig: Take 1 tablet (750 mg total) by mouth daily with breakfast.    Dispense:  90 tablet    Refill:  3    Add future refills when patient is ready   lisinopril  (ZESTRIL ) 10 MG tablet    Sig: Take 1 tablet (10 mg total) by mouth daily.    Dispense:  90 tablet    Refill:  3    Add future refills when patient is ready     Follow up plan: Return for 1 year Annual Physical, AM any day, print AVS.  Marsa Officer, DO [facility] 12/31/2023, 9:39 AM

## 2023-12-31 NOTE — Patient Instructions (Addendum)
 Thank you for coming to the office today.  All medications refilled for 1 year through [facility] ordered today  Stay tuned for results by phone this week  Consider Heart CT Scan  Prevnar-20 today today  Please schedule a Follow-up Appointment to: Return for 1 year Annual Physical, AM any day, print AVS.  If you have any other questions or concerns, please feel free to call the office or send a message through MyChart. You may also schedule an earlier appointment if necessary.  Additionally, you may be receiving a survey about your experience at our office within a few days to 1 week by e-mail or mail. We value your feedback.  Marsa Officer, DO [facility], [place]

## 2024-01-01 LAB — CBC WITH DIFFERENTIAL/PLATELET
Absolute Lymphocytes: 2392 {cells}/uL (ref 850–3900)
Absolute Monocytes: 478 {cells}/uL (ref 200–950)
Basophils Absolute: 64 {cells}/uL (ref 0–200)
Basophils Relative: 0.7 %
Eosinophils Absolute: 202 {cells}/uL (ref 15–500)
Eosinophils Relative: 2.2 %
HCT: 45.3 % (ref 38.5–50.0)
Hemoglobin: 14.9 g/dL (ref 13.2–17.1)
MCH: 30.2 pg (ref 27.0–33.0)
MCHC: 32.9 g/dL (ref 32.0–36.0)
MCV: 91.9 fL (ref 80.0–100.0)
MPV: 10 fL (ref 7.5–12.5)
Monocytes Relative: 5.2 %
Neutro Abs: 6063 {cells}/uL (ref 1500–7800)
Neutrophils Relative %: 65.9 %
Platelets: 215 Thousand/uL (ref 140–400)
RBC: 4.93 Million/uL (ref 4.20–5.80)
RDW: 12.4 % (ref 11.0–15.0)
Total Lymphocyte: 26 %
WBC: 9.2 Thousand/uL (ref 3.8–10.8)

## 2024-01-01 LAB — LIPID PANEL
Cholesterol: 149 mg/dL (ref <200)
HDL: 37 mg/dL — ABNORMAL LOW (ref >=40)
LDL Cholesterol (Calc): 86 mg/dL
Non-HDL Cholesterol (Calc): 112 mg/dL (ref <130)
Total CHOL/HDL Ratio: 4 (calc) (ref <5.0)
Triglycerides: 161 mg/dL — ABNORMAL HIGH (ref <150)

## 2024-01-01 LAB — COMPREHENSIVE METABOLIC PANEL WITH GFR
AG Ratio: 1.7 (calc) (ref 1.0–2.5)
ALT: 16 U/L (ref 9–46)
AST: 13 U/L (ref 10–35)
Albumin: 4.1 g/dL (ref 3.6–5.1)
Alkaline phosphatase (APISO): 85 U/L (ref 35–144)
BUN/Creatinine Ratio: 15 (calc) (ref 6–22)
BUN: 10 mg/dL (ref 7–25)
CO2: 30 mmol/L (ref 20–32)
Calcium: 9.3 mg/dL (ref 8.6–10.3)
Chloride: 103 mmol/L (ref 98–110)
Creat: 0.66 mg/dL — ABNORMAL LOW (ref 0.70–1.28)
Globulin: 2.4 g/dL (ref 1.9–3.7)
Glucose, Bld: 159 mg/dL — ABNORMAL HIGH (ref 65–99)
Potassium: 4.5 mmol/L (ref 3.5–5.3)
Sodium: 141 mmol/L (ref 135–146)
Total Bilirubin: 0.5 mg/dL (ref 0.2–1.2)
Total Protein: 6.5 g/dL (ref 6.1–8.1)
eGFR: 97 mL/min/1.73m2 (ref >=60)

## 2024-01-01 LAB — HEMOGLOBIN A1C
Hgb A1c MFr Bld: 7.5 % — ABNORMAL HIGH (ref <5.7)
Mean Plasma Glucose: 169 mg/dL
eAG (mmol/L): 9.3 mmol/L

## 2024-01-01 LAB — MICROALBUMIN / CREATININE URINE RATIO
Creatinine, Urine: 102 mg/dL (ref 20–320)
Microalb Creat Ratio: 10 mg/g{creat} (ref <30)
Microalb, Ur: 1 mg/dL

## 2024-01-01 LAB — TSH: TSH: 1.88 m[IU]/L (ref 0.40–4.50)

## 2024-01-01 LAB — PSA: PSA: 3 ng/mL (ref <=4.00)

## 2024-01-03 ENCOUNTER — Ambulatory Visit: Payer: Self-pay | Admitting: Family Medicine

## 2024-05-21 ENCOUNTER — Ambulatory Visit: Admitting: Emergency Medicine

## 2024-05-21 ENCOUNTER — Telehealth: Payer: Self-pay

## 2024-05-21 VITALS — BP 122/60 | Ht 72.0 in | Wt 205.2 lb

## 2024-05-21 DIAGNOSIS — Z0000 Encounter for general adult medical examination without abnormal findings: Secondary | ICD-10-CM | POA: Diagnosis not present

## 2024-05-21 DIAGNOSIS — Z0279 Encounter for issue of other medical certificate: Secondary | ICD-10-CM

## 2024-05-21 DIAGNOSIS — F17210 Nicotine dependence, cigarettes, uncomplicated: Secondary | ICD-10-CM | POA: Diagnosis not present

## 2024-05-21 NOTE — Progress Notes (Signed)
 "  Chief Complaint  Patient presents with   Medicare Wellness     Subjective:   [name] is a 79 y.o. male who presents for a Medicare Annual Wellness Visit.  Visit info / Clinical Intake: Medicare Wellness Visit Type:: Subsequent Annual Wellness Visit Persons participating in visit and providing information:: patient Medicare Wellness Visit Mode:: In-person (required for WTM) Interpreter Needed?: No Pre-visit prep was completed: yes AWV questionnaire completed by patient prior to visit?: no Living arrangements:: (!) lives alone Patient's Overall Health Status Rating: (!) fair Typical amount of pain: (!) a lot Does pain affect daily life?: (!) yes Are you currently prescribed opioids?: no  Dietary Habits and Nutritional Risks How many meals a day?: 2 Eats fruit and vegetables daily?: yes Most meals are obtained by: preparing own meals In the last 2 weeks, have you had any of the following?: (!) nausea, vomiting, diarrhea (diarrhea) Diabetic:: (!) yes Any non-healing wounds?: no How often do you check your BS?: -- (doesn't check) Would you like to be referred to a Nutritionist or for Diabetic Management? : no  Functional Status Activities of Daily Living (to include ambulation/medication): Independent Ambulation: Independent with device- listed below Home Assistive Devices/Equipment: Cane (uses cane prn) Medication Administration: Independent Home Management (perform basic housework or laundry): Independent Manage your own finances?: yes Primary transportation is: driving Concerns about vision?: (!) yes (has DM eye exam 05/22/24) Concerns about hearing?: no  Fall Screening Falls in the past year?: 1 Number of falls in past year: 1 Was there an injury with Fall?: 0 Fall Risk Category Calculator: 2 Patient Fall Risk Level: Moderate Fall Risk  Fall Risk Patient at Risk for Falls Due to: Impaired balance/gait; Impaired mobility; History of fall(s) Fall risk Follow  up: Falls evaluation completed; Education provided; Falls prevention discussed  Home and Transportation Safety: All rugs have non-skid backing?: yes All stairs or steps have railings?: yes Grab bars in the bathtub or shower?: (!) no Have non-skid surface in bathtub or shower?: yes Good home lighting?: yes Regular seat belt use?: yes Hospital stays in the last year:: (!) no; yes How many hospital stays:: 1 Reason: mild stroke  Cognitive Assessment Difficulty concentrating, remembering, or making decisions? : yes (some memory problems) Will 6CIT or Mini Cog be Completed: yes What year is it?: 0 points What month is it?: 0 points Give patient an address phrase to remember (5 components): [address] [place] About what time is it?: 0 points Count backwards from 20 to 1: 0 points Say the months of the year in reverse: 0 points Repeat the address phrase from earlier: 0 points 6 CIT Score: 0 points  Advance Directives (For Healthcare) Does Patient Have a Medical Advance Directive?: No Would patient like information on creating a medical advance directive?: No - Patient declined  Reviewed/Updated  Reviewed/Updated: Reviewed All (Medical, Surgical, Family, Medications, Allergies, Care Teams, Patient Goals)    Allergies (verified) Povidone-iodine and Iodine   Current Medications (verified) Outpatient Encounter Medications as of 05/21/2024  Medication Sig   aspirin  EC 81 MG tablet Take 81 mg by mouth daily.   guaifenesin (HUMIBID E) 400 MG TABS tablet Take 400 mg by mouth 2 (two) times daily.   lisinopril  (ZESTRIL ) 10 MG tablet Take 1 tablet (10 mg total) by mouth daily.   metFORMIN  (GLUCOPHAGE -XR) 750 MG 24 hr tablet Take 1 tablet (750 mg total) by mouth daily with breakfast.   miconazole (MICOTIN) 2 % powder Apply topically daily.  Multiple Vitamin (MULTIVITAMIN) tablet Take 1 tablet by mouth daily.   pantoprazole  (PROTONIX ) 20 MG tablet Take 1 tablet (20 mg total) by mouth  daily before breakfast.   simvastatin  (ZOCOR ) 40 MG tablet Take 1 tablet (40 mg total) by mouth daily.   tamsulosin  (FLOMAX ) 0.4 MG CAPS capsule Take 1 capsule (0.4 mg total) by mouth daily after breakfast.   UNABLE TO FIND Med Name: Prosvent 1 tablet twice a day   LACTOBACILLUS RHAMNOSUS, GG, PO Take 1 capsule by mouth daily.  (Patient not taking: Reported on 05/21/2024)   No facility-administered encounter medications on file as of 05/21/2024.    History: Past Medical History:  Diagnosis Date   COPD (chronic obstructive pulmonary disease) (HCC)    Coronary artery disease    Diabetes mellitus without complication (HCC)    Gastric ulcer with hemorrhage    GERD (gastroesophageal reflux disease)    Headache    Hypercholesterolemia    Peripheral vascular disease    Prostate enlargement    Skin cancer    Sleep apnea    Past Surgical History:  Procedure Laterality Date   CHOLECYSTECTOMY     circumscision     from army surgery for epididymitis   COLONOSCOPY WITH PROPOFOL  N/A 07/28/2015   Procedure: COLONOSCOPY WITH PROPOFOL ;  Surgeon: Lamar ONEIDA Holmes, MD;  Location[facility] ENDOSCOPY;  Service: Endoscopy;  Laterality: N/A;   CORONARY ANGIOPLASTY  x2   HERNIA REPAIR     stomach ulcer surgery     Family History  Problem Relation Age of Onset   Heart disease Mother    Stroke Father    Kidney cancer Neg Hx    Bladder Cancer Neg Hx    Prostate cancer Neg Hx    Social History   Occupational History   Occupation: retired  Tobacco Use   Smoking status: Every Day    Current packs/day: 1.00    Average packs/day: 1 pack/day for 60.0 years (60.0 ttl pk-yrs)    Types: Cigarettes    Start date: 1966   Smokeless tobacco: Never  Vaping Use   Vaping status: Never Used  Substance and Sexual Activity   Alcohol use: Yes    Comment: 1-2 beers less than monthly (6 pk a year)   Drug use: No   Sexual activity: Not on file   Tobacco Counseling Ready to quit: Not Answered Counseling given:  Not Answered  SDOH Screenings   Food Insecurity: No Food Insecurity (05/21/2024)  Housing: Low Risk (05/21/2024)  Transportation Needs: No Transportation Needs (05/21/2024)  Utilities: Not At Risk (05/21/2024)  Alcohol Screen: Low Risk (12/28/2022)  Depression (PHQ2-9): Medium Risk (05/21/2024)  Financial Resource Strain: Low Risk (04/28/2022)  Physical Activity: Inactive (05/21/2024)  Social Connections: Moderately Isolated (05/21/2024)  Stress: Stress Concern Present (05/21/2024)  Tobacco Use: High Risk (05/21/2024)  Health Literacy: Adequate Health Literacy (05/21/2024)   See flowsheets for full screening details  Depression Screen PHQ 2 & 9 Depression Scale- Over the past 2 weeks, how often have you been bothered by any of the following problems? Little interest or pleasure in doing things: 2 Feeling down, depressed, or hopeless (PHQ Adolescent also includes...irritable): 3 PHQ-2 Total Score: 5 Trouble falling or staying asleep, or sleeping too much: 0 Feeling tired or having little energy: 0 Poor appetite or overeating (PHQ Adolescent also includes...weight loss): 0 Feeling bad about yourself - or that you are a failure or have let yourself or your family down: 0 Trouble concentrating on things, such as  reading the newspaper or watching television [facility] Adolescent also includes...like school work): 0 Moving or speaking so slowly that other people could have noticed. Or the opposite - being so fidgety or restless that you have been moving around a lot more than usual: 0 Thoughts that you would be better off dead, or of hurting yourself in some way: 0 PHQ-9 Total Score: 5 If you checked off any problems, how difficult have these problems made it for you to do your work, take care of things at home, or get along with other people?: Somewhat difficult  Depression Treatment Depression Interventions/Treatment : Patient refuses Treatment     Goals Addressed             This Visit's Progress     DIET - EAT MORE FRUITS AND VEGETABLES   Not on track            Objective:    Today's Vitals   05/21/24 1005  BP: 122/60  Weight: 205 lb 3.2 oz (93.1 kg)  Height: 6' (1.829 m)   Body mass index is 27.83 kg/m.  Hearing/Vision screen Hearing Screening - Comments:: Denies hearing loss  Vision Screening - Comments:: Has DM eye exam scheduled for 05/22/24 @ [facility] Immunizations and Health Maintenance Health Maintenance  Topic Date Due   Lung Cancer Screening  11/25/2008   FOOT EXAM  12/28/2023   COVID-19 Vaccine (7 - 2025-26 season) 01/14/2024   OPHTHALMOLOGY EXAM  03/27/2024   Influenza Vaccine  08/12/2024 (Originally 12/14/2023)   DTaP/Tdap/Td (2 - Td or Tdap) 12/30/2024 (Originally 01/13/2018)   HEMOGLOBIN A1C  07/02/2024   Diabetic kidney evaluation - eGFR measurement  12/30/2024   Diabetic kidney evaluation - Urine ACR  12/30/2024   Medicare Annual Wellness (AWV)  05/21/2025   Pneumococcal Vaccine: 50+ Years  Completed   Hepatitis C Screening  Completed   Zoster Vaccines- Shingrix   Completed   Meningococcal B Vaccine  Aged Out   Colonoscopy  Discontinued        Assessment/Plan:  This is a routine wellness examination for [facility].  Patient Care Team: Edman Marsa PARAS, DO as PCP - General (Family Medicine) Administration, [name], Curtistine PARAS, MD as Consulting Physician (Ophthalmology)  I have personally reviewed and noted the following in the patients chart:   Medical and social history Use of alcohol, tobacco or illicit drugs  Current medications and supplements including opioid prescriptions. Functional ability and status Nutritional status Physical activity Advanced directives List of other physicians Hospitalizations, surgeries, and ER visits in previous 12 months Vitals Screenings to include cognitive, depression, and falls Referrals and appointments  Orders Placed This Encounter  Procedures   Ambulatory Referral Lung Cancer  Screening [facility]    Referral Priority:   Routine    Referral Type:   Consultation    Referral Reason:   Specialty Services Required    Number of Visits Requested:   1   In addition, I have reviewed and discussed with patient certain preventive protocols, quality metrics, and best practice recommendations. A written personalized care plan for preventive services as well as general preventive health recommendations were provided to patient.   Vina Ned, CMA   05/21/2024   Return in 53 weeks (on 05/27/2025) for Medicare Annual Wellness Visit.  After Visit Summary: (In Person-Printed) AVS printed and given to the patient  Nurse Notes:  Plans to get flu and Tdap vaccine (pharmacy) Needs DM foot exam at next OV on 01/01/25 Has DM eye  exam scheduled for 05/22/24 Placed referral to [facility] for lung cancer screening Declined Covid vaccine Declined DM & Nutrition education referral Screening colonoscopy no longer recommended due to age.  "

## 2024-05-21 NOTE — Patient Instructions (Signed)
 [name],  Thank you for taking the time for your Medicare Wellness Visit. I appreciate your continued commitment to your health goals. Please review the care plan we discussed, and feel free to reach out if I can assist you further.  Please note that Annual Wellness Visits do not include a physical exam. Some assessments may be limited, especially if the visit was conducted virtually. If needed, we may recommend an in-person follow-up with your provider.  Ongoing Care Seeing your primary care provider every 3 to 6 months helps us  monitor your health and provide consistent, personalized care.   Referrals If a referral was made during today's visit and you haven't received any updates within two weeks, please contact the referred provider directly to check on the status. I have placed a referral to [facility] to evaluate for lung cancer screening. Their ph# is [phone].  Recommended Screenings:  Get a diabetic eye exam as scheduled on 05/22/24.  You may get the flu and tetanus vaccine at your local pharmacy at your convenience.     Health Maintenance  Topic Date Due   Screening for Lung Cancer  11/25/2008   Medicare Annual Wellness Visit  04/29/2023   Flu Shot  12/14/2023   Complete foot exam   12/28/2023   COVID-19 Vaccine (7 - 2025-26 season) 01/14/2024   Eye exam for diabetics  03/27/2024   DTaP/Tdap/Td vaccine (2 - Td or Tdap) 12/30/2024*   Hemoglobin A1C  07/02/2024   Yearly kidney function blood test for diabetes  12/30/2024   Yearly kidney health urinalysis for diabetes  12/30/2024   Pneumococcal Vaccine for age over 41  Completed   Hepatitis C Screening  Completed   Zoster (Shingles) Vaccine  Completed   Meningitis B Vaccine  Aged Out   Colon Cancer Screening  Discontinued  *Topic was postponed. The date shown is not the original due date.       05/21/2024   10:19 AM  Advanced Directives  Does Patient Have a Medical Advance Directive? No  Would patient  like information on creating a medical advance directive? No - Patient declined    Vision: Annual vision screenings are recommended for early detection of glaucoma, cataracts, and diabetic retinopathy. These exams can also reveal signs of chronic conditions such as diabetes and high blood pressure.  Dental: Annual dental screenings help detect early signs of oral cancer, gum disease, and other conditions linked to overall health, including heart disease and diabetes.  Please see the attached documents for additional preventive care recommendations.

## 2024-05-21 NOTE — Telephone Encounter (Signed)
 Completed handicap placard form. Patient will be notified to pick up form from our office.  Marsa Officer, DO [facility] 05/21/2024, 5:28 PM

## 2024-06-04 ENCOUNTER — Telehealth: Payer: Self-pay

## 2024-06-04 DIAGNOSIS — Z122 Encounter for screening for malignant neoplasm of respiratory organs: Secondary | ICD-10-CM

## 2024-06-04 DIAGNOSIS — Z87891 Personal history of nicotine dependence: Secondary | ICD-10-CM

## 2024-06-04 DIAGNOSIS — F17210 Nicotine dependence, cigarettes, uncomplicated: Secondary | ICD-10-CM

## 2024-06-04 NOTE — Telephone Encounter (Signed)
 Lung Cancer Screening Narrative/Criteria Questionnaire (Cigarette Smokers Only- No Cigars/Pipes/vapes)   [name]   SDMV:06/12/2024 at 8:00 am [name]        05/05/1946               LDCT: 2/4/206 at 8:10 am [name]    79 y.o.   Phone: [phone]  Lung Screening Narrative (confirm age 22-77 yrs Medicare / 50-80 yrs Private pay insurance)   Insurance information: [employer]   Referring Provider: Edman, MD    This screening involves an initial phone call with a team member from our program. It is called a shared decision making visit. The initial meeting is required by  insurance and Medicare to make sure you understand the program. This appointment takes about 15-20 minutes to complete. You will complete the screening scan at your scheduled date/time.  This scan takes about 5-10 minutes to complete. You can eat and drink normally before and after the scan.  Criteria questions for Lung Cancer Screening:   Are you a current or former smoker? Current Age began smoking: 74   If you are a former smoker, what year did you quit smoking? Never quit (within 15 yrs)   To calculate your smoking history, I need an accurate estimate of how many packs of cigarettes you smoked per day and for how many years. (Not just the number of PPD you are now smoking)   Years smoking 60 x Packs per day 2 = Pack years 120   (at least 20 pack yrs)   (Make sure they understand that we need to know how much they have smoked in the past, not just the number of PPD they are smoking now)  Do you have a personal history of cancer?  Yes - (type and when diagnosed - 5 yrs cancer free) Skin cancer    Do you have a family history of cancer? No  Are you coughing up blood?  No  Have you had unexplained weight loss of 15 lbs or more in the last 6 months? No  It looks like you meet all criteria.  When would be a good time for us  to schedule you for this screening?   Additional information: N/A

## 2024-06-12 ENCOUNTER — Encounter: Payer: Self-pay | Admitting: *Deleted

## 2024-06-12 ENCOUNTER — Ambulatory Visit: Admitting: *Deleted

## 2024-06-12 DIAGNOSIS — F17210 Nicotine dependence, cigarettes, uncomplicated: Secondary | ICD-10-CM

## 2024-06-12 NOTE — Patient Instructions (Signed)

## 2024-06-12 NOTE — Progress Notes (Signed)
" °  Virtual Visit via Telephone Note  I connected with [name] on 06/12/24 at  8:00 AM EST by telephone and verified that I am speaking with the correct person using two identifiers.  Location: Patient: at home Provider: [address], [place], [place], [address]    I discussed the limitations, risks, security and privacy concerns of performing an evaluation and management service by telephone and the availability of in person appointments. I also discussed with the patient that there may be a patient responsible charge related to this service. The patient expressed understanding and agreed to proceed.   Shared Decision Making Visit Lung Cancer Screening Program [phone])   Eligibility: Age 79 y.o. Pack Years Smoking History Calculation 120 (# packs/per year x # years smoked) Recent History of coughing up blood  no Unexplained weight loss? no ( >Than 15 pounds within the last 6 months ) Prior History Lung / other cancer no (Diagnosis within the last 5 years already requiring surveillance chest CT Scans). Smoking Status Current Smoker Former Smokers: Years since quit: n/a  Quit Date: n/a  Visit Components: Discussion included one or more decision making aids. yes Discussion included risk/benefits of screening. yes Discussion included potential follow up diagnostic testing for abnormal scans. yes Discussion included meaning and risk of over diagnosis. yes Discussion included meaning and risk of False Positives. yes Discussion included meaning of total radiation exposure. yes  Counseling Included: Importance of adherence to annual lung cancer LDCT screening. yes Impact of comorbidities on ability to participate in the program. yes Ability and willingness to under diagnostic treatment. yes  Smoking Cessation Counseling: Current Smokers:  Discussed importance of smoking cessation. yes Information about tobacco cessation classes and interventions provided to patient.  yes Patient provided with ticket for LDCT Scan. no Symptomatic Patient. no  Counseling(Intermediate counseling: > three minutes) [address]406 Diagnosis Code: Tobacco Use Z72.0 Asymptomatic Patient yes  Smoking/Tobacco Cessation Counseling [name] is a current user of tobacco or nicotine products. He is not ready to quit at this time. Counseling provided today addressed the risks of continued use and the benefits of cessation. Discussed tobacco/nicotine use history, readiness to quit, and evidence-based treatment options including behavioral strategies, support resources, and pharmacologic therapies. Provided encouragement and educational materials on steps and resources to quit smoking. Patient questions were addressed, and follow-up recommended for continued support. Total time spent on counseling: 3 minutes.   Former Smokers:  Discussed the importance of maintaining cigarette abstinence. yes Diagnosis Code: Personal History of Nicotine Dependence. S12.108 Information about tobacco cessation classes and interventions provided to patient. Yes Patient provided with ticket for LDCT Scan. no Written Order for Lung Cancer Screening with LDCT placed in Epic. Yes (CT Chest Lung Cancer Screening Low Dose W/O CM) [id number] Z12.2-Screening of respiratory organs Z87.891-Personal history of nicotine dependence   Laneta Speaks, RN       "

## 2024-06-18 ENCOUNTER — Ambulatory Visit: Admission: RE | Admit: 2024-06-18 | Source: Ambulatory Visit

## 2024-12-30 ENCOUNTER — Encounter: Admitting: Family Medicine

## 2025-01-01 ENCOUNTER — Encounter: Admitting: Family Medicine

## 2025-05-27 ENCOUNTER — Ambulatory Visit
